# Patient Record
Sex: Female | Born: 1985 | Race: Black or African American | Hispanic: No | Smoking: Never smoker
Health system: Southern US, Community
[De-identification: ages and names within clinical notes are randomized; demographics above are authoritative.]

## PROBLEM LIST (undated history)

## (undated) DIAGNOSIS — I10 Essential (primary) hypertension: Secondary | ICD-10-CM

## (undated) HISTORY — PX: NO PAST SURGERIES: SHX2092

---

## 2014-03-03 LAB — PREGNANCY, URINE: Preg Test, Ur: POSITIVE

## 2014-03-17 ENCOUNTER — Other Ambulatory Visit (HOSPITAL_COMMUNITY)
Admission: RE | Admit: 2014-03-17 | Discharge: 2014-03-17 | Disposition: A | Discharge status: Home / Self Care | Payer: Medicaid Other | Source: Ambulatory Visit | Attending: Family Medicine | Admitting: Family Medicine

## 2014-03-17 ENCOUNTER — Ambulatory Visit (INDEPENDENT_AMBULATORY_CARE_PROVIDER_SITE_OTHER): Payer: Medicaid Other | Admitting: Family Medicine

## 2014-03-17 ENCOUNTER — Encounter: Payer: Self-pay | Admitting: Family Medicine

## 2014-03-17 VITALS — BP 127/85 | Temp 97.4°F | Ht 63.0 in | Wt 183.5 lb

## 2014-03-17 DIAGNOSIS — O0930 Supervision of pregnancy with insufficient antenatal care, unspecified trimester: Secondary | ICD-10-CM

## 2014-03-17 DIAGNOSIS — O0933 Supervision of pregnancy with insufficient antenatal care, third trimester: Secondary | ICD-10-CM

## 2014-03-17 DIAGNOSIS — Z01419 Encounter for gynecological examination (general) (routine) without abnormal findings: Secondary | ICD-10-CM | POA: Insufficient documentation

## 2014-03-17 DIAGNOSIS — Z113 Encounter for screening for infections with a predominantly sexual mode of transmission: Secondary | ICD-10-CM | POA: Insufficient documentation

## 2014-03-17 DIAGNOSIS — Z23 Encounter for immunization: Secondary | ICD-10-CM

## 2014-03-17 LAB — POCT URINALYSIS DIP (DEVICE)
Bilirubin Urine: NEGATIVE
Glucose, UA: NEGATIVE mg/dL
HGB URINE DIPSTICK: NEGATIVE
KETONES UR: NEGATIVE mg/dL
Leukocytes, UA: NEGATIVE
Nitrite: NEGATIVE
PH: 6 (ref 5.0–8.0)
PROTEIN: 30 mg/dL — AB
UROBILINOGEN UA: 0.2 mg/dL (ref 0.0–1.0)

## 2014-03-17 MED ORDER — PRENATAL 27-0.8 MG PO TABS: 1.0000 | ORAL_TABLET | Freq: Every day | ORAL | Status: DC

## 2014-03-17 MED ORDER — TETANUS-DIPHTH-ACELL PERTUSSIS 5-2.5-18.5 LF-MCG/0.5 IM SUSP: 0.5000 mL | Freq: Once | INTRAMUSCULAR | Status: DC

## 2014-03-17 NOTE — Progress Notes (Signed)
Subjective:    Stephanie Evans is being seen today for her first obstetrical visit.  This is not a planned pregnancy. She is at Unknown gestation. Her obstetrical history is significant for short interval pregnancy. No other risk factors. Relationship with FOB: significant other, living together. Patient does intend to breast feed. Pregnancy history fully reviewed.  Pt was [facility] once at [facility] and had an US at ~21wks. Per patient that would put her at 5831 but does not know exactly. Furthermore, pt does not remember her LMP as she was irregular while breastfeeding. Pt reports prior delivery was term at ~6.5lbs without any complications. Pt was not induced. Pt denies hx   Menstrual History: OB History   Grav Para Term Preterm Abortions TAB SAB Ect Mult Living   2 1 1  0 0 0 0 0 0 1      No LMP recorded. Patient is pregnant.    The following portions of the patient's history were reviewed and updated as appropriate: allergies, current medications, past family history, past medical history, past social history, past surgical history and problem list.  Review of Systems Pertinent items are noted in HPI.    Objective:    BP 127/85  Temp(Src) 97.4 F (36.3 C)  Ht 5\' 3"  (1.6 m)  Wt 83.235 kg (183 lb 8 oz)  BMI 32.51 kg/m2  General Appearance:    Alert, cooperative, no distress, appears stated age  Head:    Normocephalic, without obvious abnormality, atraumatic        Nose:   Nares normal, septum midline, mucosa normal, no drainage    or sinus tenderness  Throat:   Lips, mucosa, and tongue normal; teeth and gums normal  Neck:   Supple, symmetrical, trachea midline, no adenopathy;    thyroid:    Back:     Symmetric, no curvature, ROM normal, no CVA tenderness  Lungs:     Clear to auscultation bilaterally, respirations unlabored  Chest Wall:    No tenderness or deformity   Heart:    Regular rate and rhythm, S1 and S2 normal, no murmur, rub   or gallop     Abdomen:     Soft,  non-tender, bowel sounds active all four quadrants,    no masses, no organomegaly  Genitalia:    Normal female without lesion, discharge or tenderness     Extremities:   Extremities normal, atraumatic, no cyanosis or edema  Pulses:   2+ and symmetric all extremities  Skin:   Skin color, texture, turgor normal, no rashes or lesions  Lymph nodes:   Cervical, supraclavicular, and axillary nodes normal  Neurologic:   CN Grossly intact, normal strength, sensation and reflexes    throughout      Assessment:   Stephanie Evans is a 28 y.o. G2P1001 at Unknown gestational age but ~31wk here for New OB visit.   Discussed with Patient: - All new OB labs ordered. (cbc, abo/RH,  GC/C, RPR, Rubella, HBsAg, HIV, Urine Cx) -Plans to breast feed.  All questions answered. -Continue prenatal vitamins. - too late to care for genetic screening -Reviewed fetal kick counts (Pt to perform daily at a time when the baby is active, lie laterally with both hands on belly in quiet room and count all movements (hiccups, shoulder rolls, obvious kicks, etc); pt is to report to clinic or MAU for less than 10 movements felt in a one hour time period-pt told as soon as she counts 10 movements the count is complete.)  -  Routine precautions discussed (depression, infection s/s).   Patient provided with all pertinent phone numbers for emergencies. - RTC for any VB, regular, painful cramps/ctxs occurring at a rate of >2/10 min, fever (100.5 or higher), n/v/d, any pain that is unresolving or worsening, LOF, decreased fetal movement, CP, SOB, edema - f/u 2wks in [facility]  Problems: Patient Active Problem List   Diagnosis Date Noted  . Insufficient prenatal care in third trimester 03/17/2014    To Do: 1. Prenatal labs, order [place], Tdap, Glucola, Pap smear, gc/c, no GBS at this visit after [place]  [x ] Vaccines: Flu:  Tdap: today [x ] BCM: mirena  Edu: [x ] PTL precautions; [ ]  BF class; [ ]  childbirth class; [ ]   BF counseling;

## 2014-03-17 NOTE — Patient Instructions (Signed)
Third Trimester of Pregnancy  The third trimester is from week 29 through week 42, months 7 through 9. The third trimester is a time when the fetus is growing rapidly. At the end of the ninth month, the fetus is about 20 inches in length and weighs 6 10 pounds.   BODY CHANGES  Your body goes through many changes during pregnancy. The changes vary from woman to woman.    Your weight will continue to increase. You can expect to gain 25 35 pounds (11 16 kg) by the end of the pregnancy.   You may begin to get stretch marks on your hips, abdomen, and breasts.   You may urinate more often because the fetus is moving lower into your pelvis and pressing on your bladder.   You may develop or continue to have heartburn as a result of your pregnancy.   You may develop constipation because certain hormones are causing the muscles that push waste through your intestines to slow down.   You may develop hemorrhoids or swollen, bulging veins (varicose veins).   You may have pelvic pain because of the weight gain and pregnancy hormones relaxing your joints between the bones in your pelvis. Back aches may result from over exertion of the muscles supporting your posture.   Your breasts will continue to grow and be tender. A yellow discharge may leak from your breasts called colostrum.   Your belly button may stick out.   You may feel short of breath because of your expanding uterus.   You may notice the fetus "dropping," or moving lower in your abdomen.   You may have a bloody mucus discharge. This usually occurs a few days to a week before labor begins.   Your cervix becomes thin and soft (effaced) near your due date.  WHAT TO EXPECT AT YOUR PRENATAL EXAMS   You will have prenatal exams every 2 weeks until week 36. Then, you will have weekly prenatal exams. During a routine prenatal visit:   You will be weighed to make sure you and the fetus are growing normally.   Your blood pressure is taken.   Your abdomen will be  measured to track your baby's growth.   The fetal heartbeat will be listened to.   Any test results from the previous visit will be discussed.   You may have a cervical check near your due date to see if you have effaced.  At around 36 weeks, your caregiver will check your cervix. At the same time, your caregiver will also perform a test on the secretions of the vaginal tissue. This test is to determine if a type of bacteria, Group B streptococcus, is present. Your caregiver will explain this further.  Your caregiver may ask you:   What your birth plan is.   How you are feeling.   If you are feeling the baby move.   If you have had any abnormal symptoms, such as leaking fluid, bleeding, severe headaches, or abdominal cramping.   If you have any questions.  Other tests or screenings that may be performed during your third trimester include:   Blood tests that check for low iron levels (anemia).   Fetal testing to check the health, activity level, and growth of the fetus. Testing is done if you have certain medical conditions or if there are problems during the pregnancy.  FALSE LABOR  You may feel small, irregular contractions that eventually go away. These are called Braxton Hicks contractions, or   false labor. Contractions may last for hours, days, or even weeks before true labor sets in. If contractions come at regular intervals, intensify, or become painful, it is best to be seen by your caregiver.   SIGNS OF LABOR    Menstrual-like cramps.   Contractions that are 5 minutes apart or less.   Contractions that start on the top of the uterus and spread down to the lower abdomen and back.   A sense of increased pelvic pressure or back pain.   A watery or bloody mucus discharge that comes from the vagina.  If you have any of these signs before the 37th week of pregnancy, call your caregiver right away. You need to go to the hospital to get checked immediately.  HOME CARE INSTRUCTIONS    Avoid all  smoking, herbs, alcohol, and unprescribed drugs. These chemicals affect the formation and growth of the baby.   Follow your caregiver's instructions regarding medicine use. There are medicines that are either safe or unsafe to take during pregnancy.   Exercise only as directed by your caregiver. Experiencing uterine cramps is a good sign to stop exercising.   Continue to eat regular, healthy meals.   Wear a good support bra for breast tenderness.   Do not use hot tubs, steam rooms, or saunas.   Wear your seat belt at all times when driving.   Avoid raw meat, uncooked cheese, cat litter boxes, and soil used by cats. These carry germs that can cause birth defects in the baby.   Take your prenatal vitamins.   Try taking a stool softener (if your caregiver approves) if you develop constipation. Eat more high-fiber foods, such as fresh vegetables or fruit and whole grains. Drink plenty of fluids to keep your urine clear or pale yellow.   Take warm sitz baths to soothe any pain or discomfort caused by hemorrhoids. Use hemorrhoid cream if your caregiver approves.   If you develop varicose veins, wear support hose. Elevate your feet for 15 minutes, 3 4 times a day. Limit salt in your diet.   Avoid heavy lifting, wear low heal shoes, and practice good posture.   Rest a lot with your legs elevated if you have leg cramps or low back pain.   Visit your dentist if you have not gone during your pregnancy. Use a soft toothbrush to brush your teeth and be gentle when you floss.   A sexual relationship may be continued unless your caregiver directs you otherwise.   Do not travel far distances unless it is absolutely necessary and only with the approval of your caregiver.   Take prenatal classes to understand, practice, and ask questions about the labor and delivery.   Make a trial run to the hospital.   Pack your hospital bag.   Prepare the baby's nursery.   Continue to go to all your prenatal visits as directed  by your caregiver.  SEEK MEDICAL CARE IF:   You are unsure if you are in labor or if your water has broken.   You have dizziness.   You have mild pelvic cramps, pelvic pressure, or nagging pain in your abdominal area.   You have persistent nausea, vomiting, or diarrhea.   You have a bad smelling vaginal discharge.   You have pain with urination.  SEEK IMMEDIATE MEDICAL CARE IF:    You have a fever.   You are leaking fluid from your vagina.   You have spotting or bleeding from your vagina.     You have severe abdominal cramping or pain.   You have rapid weight loss or gain.   You have shortness of breath with chest pain.   You notice sudden or extreme swelling of your face, hands, ankles, feet, or legs.   You have not felt your baby move in over an hour.   You have severe headaches that do not go away with medicine.   You have vision changes.  Document Released: 11/29/2001 Document Revised: 08/07/2013 Document Reviewed: 02/05/2013  Exit[employer] Patient Information 2014 [employer].

## 2014-03-17 NOTE — Progress Notes (Signed)
U/S scheduled 03/25/14 at 830 am. ROI signed for [facility] records.

## 2014-03-17 NOTE — Progress Notes (Signed)
Nutrition note: 1st visit consult Pt has gained 18.5# @ 31w (due 05/16/14 per pt), which is wnl so far. Pt reports eating 2 meals & 1-2 snacks/d. Pt is taking a PNV. Pt reports no N/V but has some heartburn. NKFA. Pt received verbal & written education on general nutrition during pregnancy. Discussed tips to decrease heartburn. Discussed wt gain goals of 15-25# or 0.6#/wk. Pt agrees to continue taking a PNV. Pt does not have WIC but [place]. F/u as needed Blondell RevealLaura Autumn Pruitt, MS, RD, LDN, [facility]

## 2014-03-17 NOTE — Progress Notes (Signed)
P= 83 Pt. Reports that she found out she was pregnant at 5511w5d; reports she never had a period because she had been breast feeding her daughter who is now 18months. [facility]-- will request ultrasound records. Will need to schedule growth and anatomy ultrasound today.  No PNC prior to today. Will do all OB labs as well as 1hr gtt as pt. Reports being 31w today. Tdap today.

## 2014-03-18 LAB — PRESCRIPTION MONITORING PROFILE (19 PANEL)
Amphetamine/Meth: NEGATIVE ng/mL
BENZODIAZEPINE SCREEN, URINE: NEGATIVE ng/mL
Barbiturate Screen, Urine: NEGATIVE ng/mL
Buprenorphine, Urine: NEGATIVE ng/mL
CARISOPRODOL, URINE: NEGATIVE ng/mL
Cannabinoid Scrn, Ur: NEGATIVE ng/mL
Cocaine Metabolites: NEGATIVE ng/mL
Creatinine, Urine: 190.06 mg/dL (ref >20.0)
ECSTASY: NEGATIVE ng/mL
Fentanyl, Ur: NEGATIVE ng/mL
MEPERIDINE UR: NEGATIVE ng/mL
METHADONE SCREEN, URINE: NEGATIVE ng/mL
Methaqualone: NEGATIVE ng/mL
Nitrites, Initial: NEGATIVE ug/mL
Opiate Screen, Urine: NEGATIVE ng/mL
Oxycodone Screen, Ur: NEGATIVE ng/mL
PH URINE, INITIAL: 6.2 pH (ref 4.5–8.9)
Phencyclidine, Ur: NEGATIVE ng/mL
Propoxyphene: NEGATIVE ng/mL
TAPENTADOLUR: NEGATIVE ng/mL
TRAMADOL UR: NEGATIVE ng/mL
Zolpidem, Urine: NEGATIVE ng/mL

## 2014-03-18 LAB — HEMOGLOBINOPATHY EVALUATION
HEMOGLOBIN OTHER: 0 %
HGB A2 QUANT: 2.5 % (ref 2.2–3.2)
HGB A: 97.5 % (ref 96.8–97.8)
Hgb F Quant: 0 % (ref 0.0–2.0)
Hgb S Quant: 0 %

## 2014-03-18 LAB — OBSTETRIC PANEL
ANTIBODY SCREEN: NEGATIVE
BASOS ABS: 0 10*3/uL (ref 0.0–0.1)
Basophils Relative: 0 % (ref 0–1)
EOS ABS: 0.2 10*3/uL (ref 0.0–0.7)
EOS PCT: 2 % (ref 0–5)
HEMATOCRIT: 33 % — AB (ref 36.0–46.0)
Hemoglobin: 11.1 g/dL — ABNORMAL LOW (ref 12.0–15.0)
Hepatitis B Surface Ag: NEGATIVE
Lymphocytes Relative: 19 % (ref 12–46)
Lymphs Abs: 1.6 10*3/uL (ref 0.7–4.0)
MCH: 27.5 pg (ref 26.0–34.0)
MCHC: 33.6 g/dL (ref 30.0–36.0)
MCV: 81.7 fL (ref 78.0–100.0)
Monocytes Absolute: 0.5 10*3/uL (ref 0.1–1.0)
Monocytes Relative: 6 % (ref 3–12)
Neutro Abs: 6.3 10*3/uL (ref 1.7–7.7)
Neutrophils Relative %: 73 % (ref 43–77)
PLATELETS: 201 10*3/uL (ref 150–400)
RBC: 4.04 MIL/uL (ref 3.87–5.11)
RDW: 13.4 % (ref 11.5–15.5)
RUBELLA: 1.76 {index} — AB (ref <0.90)
Rh Type: POSITIVE
WBC: 8.6 10*3/uL (ref 4.0–10.5)

## 2014-03-18 LAB — HIV ANTIBODY (ROUTINE TESTING W REFLEX): HIV: NONREACTIVE

## 2014-03-18 LAB — WET PREP, GENITAL
TRICH WET PREP: NONE SEEN
WBC, Wet Prep HPF POC: NONE SEEN
Yeast Wet Prep HPF POC: NONE SEEN

## 2014-03-18 LAB — GLUCOSE TOLERANCE, 1 HOUR (50G) W/O FASTING: GLUCOSE 1 HOUR GTT: 144 mg/dL — AB (ref 70–140)

## 2014-03-19 ENCOUNTER — Encounter: Payer: Self-pay | Admitting: *Deleted

## 2014-03-20 LAB — CULTURE, OB URINE

## 2014-03-24 ENCOUNTER — Telehealth: Payer: Self-pay | Admitting: General Practice

## 2014-03-24 ENCOUNTER — Encounter: Payer: Self-pay | Admitting: Family Medicine

## 2014-03-24 ENCOUNTER — Telehealth: Payer: Self-pay | Admitting: *Deleted

## 2014-03-24 DIAGNOSIS — B9689 Other specified bacterial agents as the cause of diseases classified elsewhere: Secondary | ICD-10-CM

## 2014-03-24 DIAGNOSIS — O0930 Supervision of pregnancy with insufficient antenatal care, unspecified trimester: Secondary | ICD-10-CM

## 2014-03-24 DIAGNOSIS — N760 Acute vaginitis: Principal | ICD-10-CM

## 2014-03-24 MED ORDER — PRENATAL PLUS 27-1 MG PO TABS: 1.0000 | ORAL_TABLET | Freq: Every day | ORAL | Status: DC

## 2014-03-24 MED ORDER — METRONIDAZOLE 500 MG PO TABS: 500.0000 mg | ORAL_TABLET | Freq: Two times a day (BID) | ORAL | Status: DC

## 2014-03-24 NOTE — Telephone Encounter (Signed)
Contacted patient about Glucola results and need for 3 hour glucola test.  Pt agreed to Thursday 03/27/2014 @ 0800.  Pt instructions given for test. Pt verbalizes understanding.

## 2014-03-24 NOTE — Telephone Encounter (Signed)
Message copied by Dorothyann PengHAIZLIP, Marlis Oldaker E on Mon Mar 24, 2014  4:39 PM ------      Message from: Jolyn LentDOM, MICHAEL R      Created: Mon Mar 24, 2014  4:12 PM       Please coordinate 3hr glucola       ------

## 2014-03-24 NOTE — Telephone Encounter (Signed)
Per chart review, patient has few clue cells. Called patient and she stated that she has been having a discharge lately, informed patient of mild BV infection and that we would send the medication to her pharmacy. Patient verbalized understanding stated she has had this before and also requests PNV because her insurance won't cover the other ones prescribed. Told patient I would get both of these sent to her pharmacy. Patient verbalized understanding and had no further questions

## 2014-03-25 ENCOUNTER — Ambulatory Visit (HOSPITAL_COMMUNITY)
Admission: RE | Admit: 2014-03-25 | Discharge: 2014-03-25 | Disposition: A | Discharge status: Home / Self Care | Payer: Medicaid Other | Source: Ambulatory Visit | Attending: Family Medicine | Admitting: Family Medicine

## 2014-03-25 ENCOUNTER — Encounter: Payer: Self-pay | Admitting: Family Medicine

## 2014-03-25 DIAGNOSIS — O0930 Supervision of pregnancy with insufficient antenatal care, unspecified trimester: Secondary | ICD-10-CM | POA: Insufficient documentation

## 2014-03-25 DIAGNOSIS — O0933 Supervision of pregnancy with insufficient antenatal care, third trimester: Secondary | ICD-10-CM

## 2014-03-25 DIAGNOSIS — Z3689 Encounter for other specified antenatal screening: Secondary | ICD-10-CM | POA: Insufficient documentation

## 2014-03-25 DIAGNOSIS — Z23 Encounter for immunization: Secondary | ICD-10-CM

## 2014-03-27 ENCOUNTER — Other Ambulatory Visit: Payer: Medicaid Other

## 2014-03-31 ENCOUNTER — Other Ambulatory Visit: Payer: Medicaid Other

## 2014-03-31 DIAGNOSIS — O99814 Abnormal glucose complicating childbirth: Secondary | ICD-10-CM

## 2014-04-01 LAB — GLUCOSE TOLERANCE, 3 HOURS
GLUCOSE 3 HOUR GTT: 121 mg/dL (ref 70–144)
GLUCOSE, 1 HOUR-GESTATIONAL: 168 mg/dL (ref 70–189)
GLUCOSE, 2 HOUR-GESTATIONAL: 160 mg/dL (ref 70–164)
GLUCOSE, FASTING-GESTATIONAL: 72 mg/dL (ref 70–104)

## 2014-04-02 ENCOUNTER — Ambulatory Visit (INDEPENDENT_AMBULATORY_CARE_PROVIDER_SITE_OTHER): Payer: Medicaid Other | Admitting: Family Medicine

## 2014-04-02 ENCOUNTER — Encounter: Payer: Self-pay | Admitting: Family Medicine

## 2014-04-02 VITALS — BP 118/73 | Wt 184.8 lb

## 2014-04-02 DIAGNOSIS — O0930 Supervision of pregnancy with insufficient antenatal care, unspecified trimester: Secondary | ICD-10-CM

## 2014-04-02 DIAGNOSIS — O0933 Supervision of pregnancy with insufficient antenatal care, third trimester: Secondary | ICD-10-CM

## 2014-04-02 LAB — POCT URINALYSIS DIP (DEVICE)
Bilirubin Urine: NEGATIVE
GLUCOSE, UA: NEGATIVE mg/dL
HGB URINE DIPSTICK: NEGATIVE
Ketones, ur: NEGATIVE mg/dL
Leukocytes, UA: NEGATIVE
Nitrite: NEGATIVE
PROTEIN: NEGATIVE mg/dL
Specific Gravity, Urine: 1.025 (ref 1.005–1.030)
UROBILINOGEN UA: 0.2 mg/dL (ref 0.0–1.0)
pH: 7 (ref 5.0–8.0)

## 2014-04-02 NOTE — Progress Notes (Signed)
Pulse: 75

## 2014-04-02 NOTE — Patient Instructions (Signed)
Third Trimester of Pregnancy  The third trimester is from week 29 through week 42, months 7 through 9. The third trimester is a time when the fetus is growing rapidly. At the end of the ninth month, the fetus is about 20 inches in length and weighs 6 10 pounds.   BODY CHANGES  Your body goes through many changes during pregnancy. The changes vary from woman to woman.    Your weight will continue to increase. You can expect to gain 25 35 pounds (11 16 kg) by the end of the pregnancy.   You may begin to get stretch marks on your hips, abdomen, and breasts.   You may urinate more often because the fetus is moving lower into your pelvis and pressing on your bladder.   You may develop or continue to have heartburn as a result of your pregnancy.   You may develop constipation because certain hormones are causing the muscles that push waste through your intestines to slow down.   You may develop hemorrhoids or swollen, bulging veins (varicose veins).   You may have pelvic pain because of the weight gain and pregnancy hormones relaxing your joints between the bones in your pelvis. Back aches may result from over exertion of the muscles supporting your posture.   Your breasts will continue to grow and be tender. A yellow discharge may leak from your breasts called colostrum.   Your belly button may stick out.   You may feel short of breath because of your expanding uterus.   You may notice the fetus "dropping," or moving lower in your abdomen.   You may have a bloody mucus discharge. This usually occurs a few days to a week before labor begins.   Your cervix becomes thin and soft (effaced) near your due date.  WHAT TO EXPECT AT YOUR PRENATAL EXAMS   You will have prenatal exams every 2 weeks until week 36. Then, you will have weekly prenatal exams. During a routine prenatal visit:   You will be weighed to make sure you and the fetus are growing normally.   Your blood pressure is taken.   Your abdomen will be  measured to track your baby's growth.   The fetal heartbeat will be listened to.   Any test results from the previous visit will be discussed.   You may have a cervical check near your due date to see if you have effaced.  At around 36 weeks, your caregiver will check your cervix. At the same time, your caregiver will also perform a test on the secretions of the vaginal tissue. This test is to determine if a type of bacteria, Group B streptococcus, is present. Your caregiver will explain this further.  Your caregiver may ask you:   What your birth plan is.   How you are feeling.   If you are feeling the baby move.   If you have had any abnormal symptoms, such as leaking fluid, bleeding, severe headaches, or abdominal cramping.   If you have any questions.  Other tests or screenings that may be performed during your third trimester include:   Blood tests that check for low iron levels (anemia).   Fetal testing to check the health, activity level, and growth of the fetus. Testing is done if you have certain medical conditions or if there are problems during the pregnancy.  FALSE LABOR  You may feel small, irregular contractions that eventually go away. These are called Braxton Hicks contractions, or   false labor. Contractions may last for hours, days, or even weeks before true labor sets in. If contractions come at regular intervals, intensify, or become painful, it is best to be seen by your caregiver.   SIGNS OF LABOR    Menstrual-like cramps.   Contractions that are 5 minutes apart or less.   Contractions that start on the top of the uterus and spread down to the lower abdomen and back.   A sense of increased pelvic pressure or back pain.   A watery or bloody mucus discharge that comes from the vagina.  If you have any of these signs before the 37th week of pregnancy, call your caregiver right away. You need to go to the hospital to get checked immediately.  HOME CARE INSTRUCTIONS    Avoid all  smoking, herbs, alcohol, and unprescribed drugs. These chemicals affect the formation and growth of the baby.   Follow your caregiver's instructions regarding medicine use. There are medicines that are either safe or unsafe to take during pregnancy.   Exercise only as directed by your caregiver. Experiencing uterine cramps is a good sign to stop exercising.   Continue to eat regular, healthy meals.   Wear a good support bra for breast tenderness.   Do not use hot tubs, steam rooms, or saunas.   Wear your seat belt at all times when driving.   Avoid raw meat, uncooked cheese, cat litter boxes, and soil used by cats. These carry germs that can cause birth defects in the baby.   Take your prenatal vitamins.   Try taking a stool softener (if your caregiver approves) if you develop constipation. Eat more high-fiber foods, such as fresh vegetables or fruit and whole grains. Drink plenty of fluids to keep your urine clear or pale yellow.   Take warm sitz baths to soothe any pain or discomfort caused by hemorrhoids. Use hemorrhoid cream if your caregiver approves.   If you develop varicose veins, wear support hose. Elevate your feet for 15 minutes, 3 4 times a day. Limit salt in your diet.   Avoid heavy lifting, wear low heal shoes, and practice good posture.   Rest a lot with your legs elevated if you have leg cramps or low back pain.   Visit your dentist if you have not gone during your pregnancy. Use a soft toothbrush to brush your teeth and be gentle when you floss.   A sexual relationship may be continued unless your caregiver directs you otherwise.   Do not travel far distances unless it is absolutely necessary and only with the approval of your caregiver.   Take prenatal classes to understand, practice, and ask questions about the labor and delivery.   Make a trial run to the hospital.   Pack your hospital bag.   Prepare the baby's nursery.   Continue to go to all your prenatal visits as directed  by your caregiver.  SEEK MEDICAL CARE IF:   You are unsure if you are in labor or if your water has broken.   You have dizziness.   You have mild pelvic cramps, pelvic pressure, or nagging pain in your abdominal area.   You have persistent nausea, vomiting, or diarrhea.   You have a bad smelling vaginal discharge.   You have pain with urination.  SEEK IMMEDIATE MEDICAL CARE IF:    You have a fever.   You are leaking fluid from your vagina.   You have spotting or bleeding from your vagina.     You have severe abdominal cramping or pain.   You have rapid weight loss or gain.   You have shortness of breath with chest pain.   You notice sudden or extreme swelling of your face, hands, ankles, feet, or legs.   You have not felt your baby move in over an hour.   You have severe headaches that do not go away with medicine.   You have vision changes.  Document Released: 11/29/2001 Document Revised: 08/07/2013 Document Reviewed: 02/05/2013  Exit[employer] Patient Information 2014 [employer].

## 2014-04-02 NOTE — Progress Notes (Signed)
+  FM, no lof, no vb, no ctx No complaints Reviewed imaging - requires repeat in 4 weeks  Reeves DamDarnesa Francee GentileShubert is a 28 y.o. G2P1001 at 4634w5d by L=32 here for ROB visit.  Discussed with Patient:  -Plans to breast feed.  All questions answered. -Continue prenatal vitamins. -Reviewed fetal kick counts Pt to perform daily at a time when the baby is active, lie laterally with both hands on belly in quiet room and count all movements (hiccups, shoulder rolls, obvious kicks, etc); pt is to report to clinic L&D for less than 10 movements felt in a one hour time period-pt told as soon as she counts 10 movements the count is complete.  - Routine precautions discussed (depression, infection s/s).   Patient provided with all pertinent phone numbers for emergencies. - RTC for any VB, regular, painful cramps/ctxs occurring at a rate of >2/10 min, fever (100.5 or higher), n/v/d, any pain that is unresolving or worsening, LOF, decreased fetal movement, CP, SOB, edema - RTC in 2 weeks for next appt. - GBS test next visit  Problems: Patient Active Problem List   Diagnosis Date Noted  . Insufficient prenatal care in third trimester 03/17/2014    To Do: 1.  [x ] Vaccines: Flu:dec  Tdap: recd [ x] BCM: mirena [ x] Readiness: baby has a place to sleep, car seat, other baby necessities.  Edu: [x ] PTL precautions; [ ]  BF class; [ ]  childbirth class; [ ]   BF counseling;

## 2014-04-02 NOTE — Progress Notes (Signed)
US for growth scheduled on 5/5 @ 0930

## 2014-04-16 ENCOUNTER — Encounter: Payer: Medicaid Other | Admitting: Advanced Practice Midwife

## 2014-04-22 ENCOUNTER — Ambulatory Visit (HOSPITAL_COMMUNITY)
Admission: RE | Admit: 2014-04-22 | Discharge: 2014-04-22 | Disposition: A | Discharge status: Home / Self Care | Payer: Medicaid Other | Source: Ambulatory Visit | Attending: Family Medicine | Admitting: Family Medicine

## 2014-04-22 DIAGNOSIS — O0933 Supervision of pregnancy with insufficient antenatal care, third trimester: Secondary | ICD-10-CM

## 2014-04-22 DIAGNOSIS — O0930 Supervision of pregnancy with insufficient antenatal care, unspecified trimester: Secondary | ICD-10-CM | POA: Insufficient documentation

## 2014-04-22 DIAGNOSIS — Z3689 Encounter for other specified antenatal screening: Secondary | ICD-10-CM | POA: Insufficient documentation

## 2014-04-24 ENCOUNTER — Ambulatory Visit (INDEPENDENT_AMBULATORY_CARE_PROVIDER_SITE_OTHER): Payer: Medicaid Other | Admitting: Obstetrics & Gynecology

## 2014-04-24 VITALS — BP 117/79 | HR 87 | Temp 97.0°F | Wt 186.0 lb

## 2014-04-24 DIAGNOSIS — Z3480 Encounter for supervision of other normal pregnancy, unspecified trimester: Secondary | ICD-10-CM

## 2014-04-24 DIAGNOSIS — Z3490 Encounter for supervision of normal pregnancy, unspecified, unspecified trimester: Secondary | ICD-10-CM

## 2014-04-24 DIAGNOSIS — O0933 Supervision of pregnancy with insufficient antenatal care, third trimester: Secondary | ICD-10-CM

## 2014-04-24 DIAGNOSIS — O0930 Supervision of pregnancy with insufficient antenatal care, unspecified trimester: Secondary | ICD-10-CM

## 2014-04-24 LAB — POCT URINALYSIS DIP (DEVICE)
BILIRUBIN URINE: NEGATIVE
Glucose, UA: NEGATIVE mg/dL
Hgb urine dipstick: NEGATIVE
Ketones, ur: NEGATIVE mg/dL
LEUKOCYTES UA: NEGATIVE
NITRITE: NEGATIVE
PH: 6 (ref 5.0–8.0)
Protein, ur: 30 mg/dL — AB
Specific Gravity, Urine: >=1.03 (ref 1.005–1.030)
UROBILINOGEN UA: 0.2 mg/dL (ref 0.0–1.0)

## 2014-04-24 NOTE — Patient Instructions (Signed)

## 2014-04-24 NOTE — Progress Notes (Signed)
CT an GC and GBS today. US nl growth poor dating though

## 2014-04-25 LAB — GC/CHLAMYDIA PROBE AMP
CT Probe RNA: NEGATIVE
GC Probe RNA: NEGATIVE

## 2014-04-27 LAB — CULTURE, STREPTOCOCCUS GRP B W/SUSCEPT

## 2014-05-01 ENCOUNTER — Encounter: Payer: Self-pay | Admitting: Obstetrics & Gynecology

## 2014-05-01 ENCOUNTER — Encounter: Payer: Medicaid Other | Admitting: Obstetrics & Gynecology

## 2014-05-03 ENCOUNTER — Encounter: Payer: Self-pay | Admitting: *Deleted

## 2014-05-14 ENCOUNTER — Encounter: Payer: Medicaid Other | Admitting: Advanced Practice Midwife

## 2014-05-16 ENCOUNTER — Encounter: Payer: Self-pay | Admitting: General Practice

## 2014-05-16 ENCOUNTER — Encounter: Payer: Medicaid Other | Admitting: Family

## 2014-06-16 ENCOUNTER — Encounter: Payer: Self-pay | Admitting: Family Medicine

## 2014-06-16 ENCOUNTER — Ambulatory Visit (INDEPENDENT_AMBULATORY_CARE_PROVIDER_SITE_OTHER): Payer: Medicaid Other | Admitting: Family Medicine

## 2014-06-16 VITALS — BP 139/90 | HR 87 | Temp 97.5°F | Ht 63.0 in | Wt 172.2 lb

## 2014-06-16 DIAGNOSIS — Z01812 Encounter for preprocedural laboratory examination: Secondary | ICD-10-CM

## 2014-06-16 DIAGNOSIS — Z30430 Encounter for insertion of intrauterine contraceptive device: Secondary | ICD-10-CM

## 2014-06-16 LAB — POCT PREGNANCY, URINE: Preg Test, Ur: NEGATIVE

## 2014-06-16 MED ORDER — LEVONORGESTREL 20 MCG/24HR IU IUD
INTRAUTERINE_SYSTEM | Freq: Once | INTRAUTERINE | Status: AC
  Administered 2014-06-16: 1 via INTRAUTERINE

## 2014-06-16 NOTE — Patient Instructions (Signed)

## 2014-06-16 NOTE — Progress Notes (Signed)
Patient ID: Stephanie KaufmanDarnesa Lasker, female   DOB: 1986-11-14, 28 y.o.   MRN: [id number] Subjective:    Stephanie KaufmanDarnesa Cohen is a 28 y.o. 212P2002 African American female who presents for a postpartum visit. She is 6 weeks postpartum following a spontaneous vaginal delivery. I have fully reviewed the prenatal and intrapartum course. The delivery was at 39 gestational weeks. Outcome: spontaneous vaginal delivery. Anesthesia: none. Postpartum course has been uncomplicated. Baby's course has been uncomplicated. Baby is feeding by breast. Bleeding stopped and now returned. Bowel function is normal. Bladder function is normal. Patient is not sexually active. Contraception method is IUD. Postpartum depression screening: negative.  The following portions of the patient's history were reviewed and updated as appropriate: allergies, current medications, past medical history, past surgical history and problem list.  Review of Systems Pertinent items are noted in HPI.   Filed Vitals:   06/16/14 1435 06/16/14 1437  BP: 139/90 139/90  Pulse: 87   Temp: 97.5 F (36.4 C)   TempSrc: Oral   Height: 5\' 3"  (1.6 m)   Weight: 172 lb 3.2 oz (78.109 kg)     Objective:     General:  alert, cooperative and no distress   Breasts:  deferred, no complaints  Lungs: clear to auscultation bilaterally  Heart:  regular rate and rhythm  Abdomen: soft, nontender   Vulva: normal  Vagina: normal vagina  Cervix:  closed  Corpus: Well-involuted  Adnexa:  Non-palpable  Rectal Exam: no hemorrhoids        Assessment:   normal postpartum exam 6 wks s/p NSVD Depression screening Contraception counseling   Plan:   Contraception: IUD Follow up in: 4 weeks for string check or as needed.   MIRENA IUD INSERTION  The risks and benefits of the method and placement have been thouroughly reviewed with the patient and all questions were answered.  Specifically the patient is aware of failure rate of 12/998, expulsion of the IUD and of  possible perforation.  The patient is aware of irregular bleeding due to the method and understands the incidence of irregular bleeding diminishes with time.  Signed copy of informed consent in chart.   Time out was performed.  A Pederson speculum was placed in the vagina.  The cervix was visualized, prepped using Betadin, and grasped with a single tooth tenaculum. The uterus was found to be anteroflexed and it sounded to 9 cm.  Mirena IUD placed per manufacturer's recommendations.   The strings were trimmed to 3 cm.  The patient was given post procedure instructions, including signs and symptoms of infection and to check for the strings after each menses or each month, and refraining from intercourse or anything in the vagina for 3 days.  She was given a Mirena care card with date Mirena placed, and date Mirena to be removed.  She is scheduled for a return appointment after her first menses or 4 weeks.  Kalis Friese L 06/16/2014 3:27 PM

## 2014-07-11 ENCOUNTER — Encounter: Payer: Self-pay | Admitting: General Practice

## 2014-07-29 ENCOUNTER — Encounter: Payer: Self-pay | Admitting: *Deleted

## 2014-07-29 ENCOUNTER — Telehealth: Payer: Self-pay | Admitting: *Deleted

## 2014-07-29 NOTE — Progress Notes (Signed)
FMLA papers completed. 

## 2014-07-29 NOTE — Telephone Encounter (Signed)
Attempted to contact patient, no answer, mailbox full, unable to leave a message.  FMLA paperwork completed, [facility] [phone].  Paperwork placed in front office box.

## 2014-08-12 ENCOUNTER — Encounter: Payer: Self-pay | Admitting: General Practice

## 2014-08-28 ENCOUNTER — Encounter: Payer: Self-pay | Admitting: *Deleted

## 2014-08-28 ENCOUNTER — Ambulatory Visit: Payer: Medicaid Other | Admitting: Obstetrics and Gynecology

## 2014-08-28 ENCOUNTER — Telehealth: Payer: Self-pay | Admitting: *Deleted

## 2014-08-28 NOTE — Telephone Encounter (Signed)
Attempted to call patient with both numbers listed, numbers are invalid.  Will send letter.  Letter sent

## 2014-10-20 ENCOUNTER — Encounter: Payer: Self-pay | Admitting: Family Medicine

## 2015-01-07 IMAGING — US US OB COMP +14 WK
1 series · 12 of 28 positions shown · non-contrast
Comparison: none

[Series 1: us ob comp +14 wk · 12 of 68 slices shown]
[im 3/68]
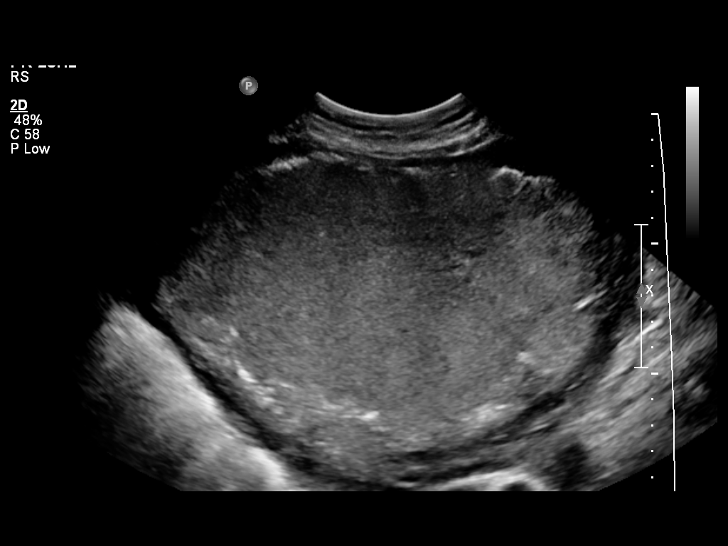
[im 8/68]
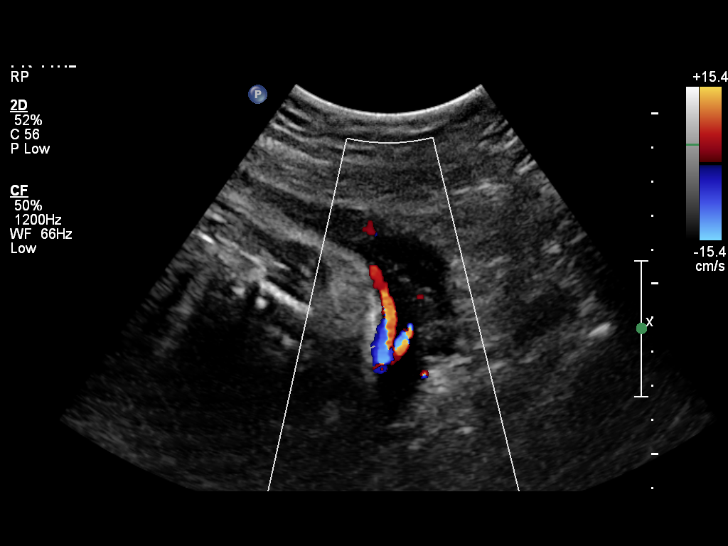
[im 13/68]
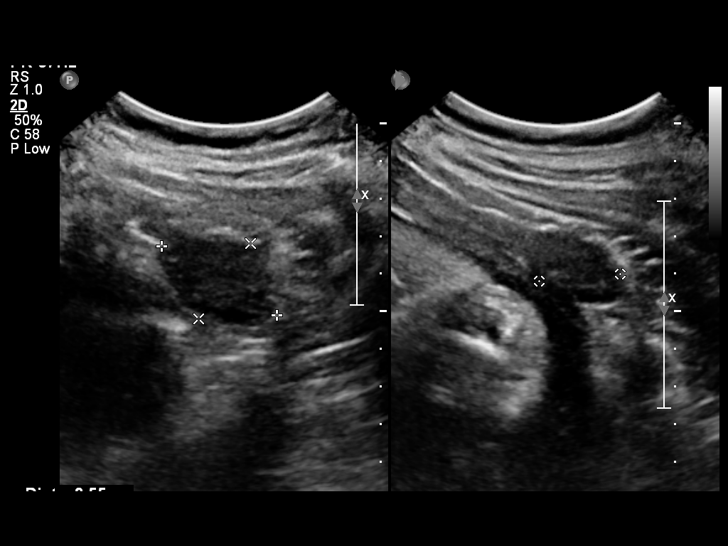
[im 20/68]
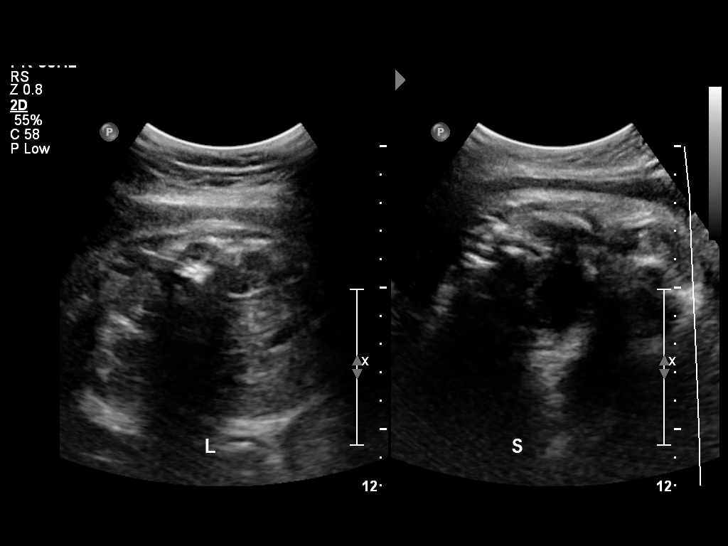
[im 25/68]
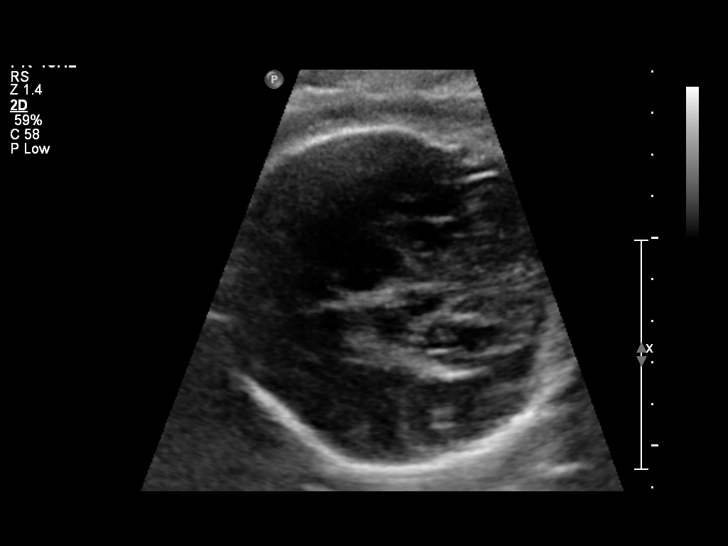
[im 30/68]
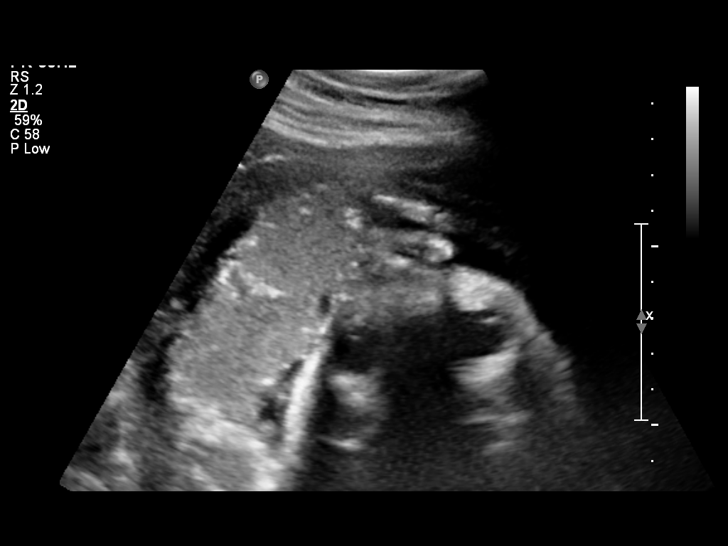
[im 38/68]
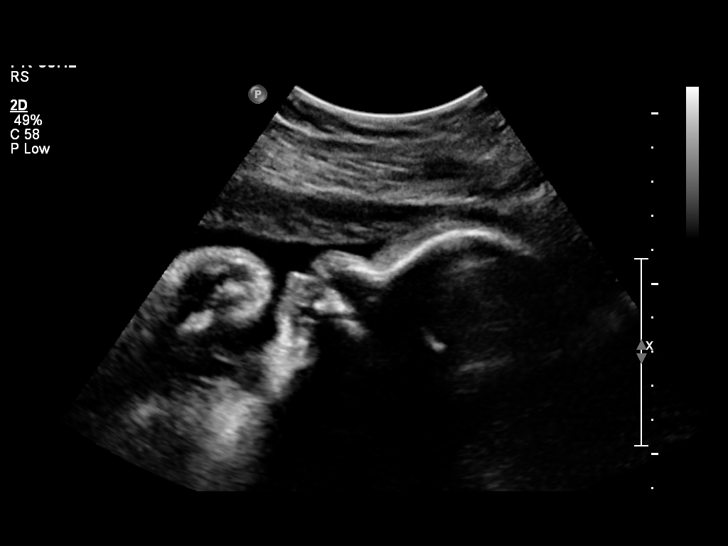
[im 43/68]
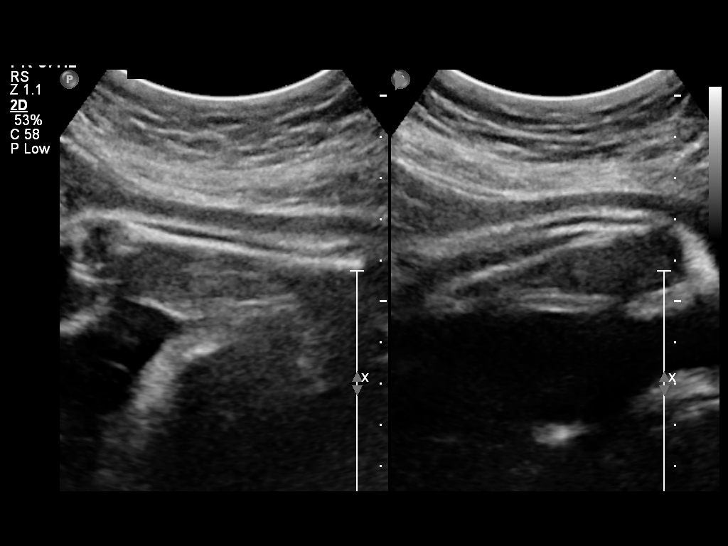
[im 48/68]
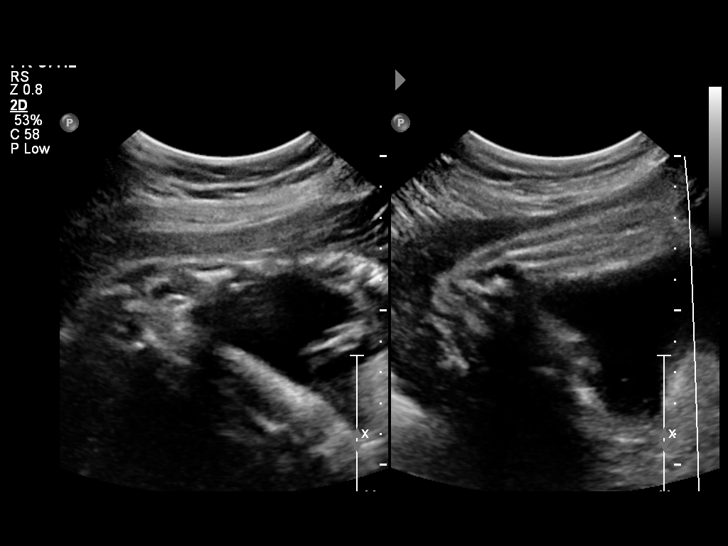
[im 55/68]
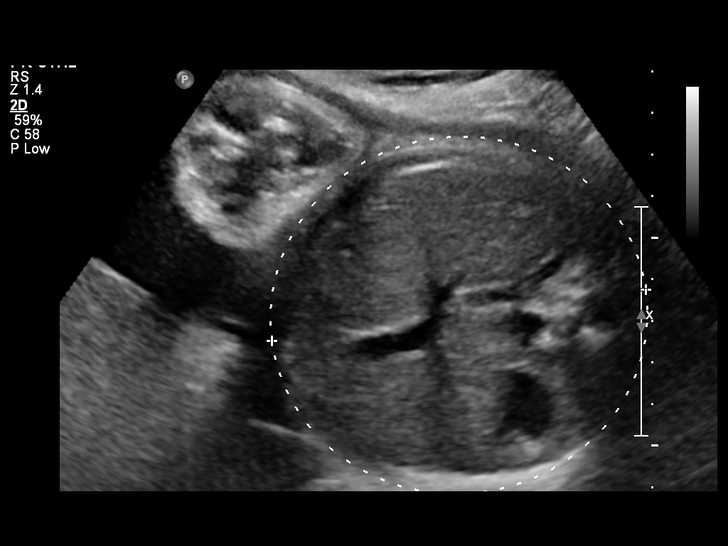
[im 60/68]
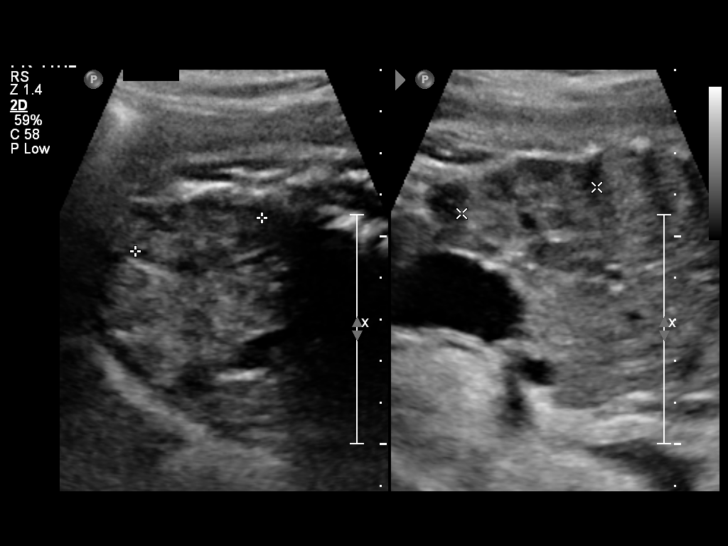
[im 65/68]
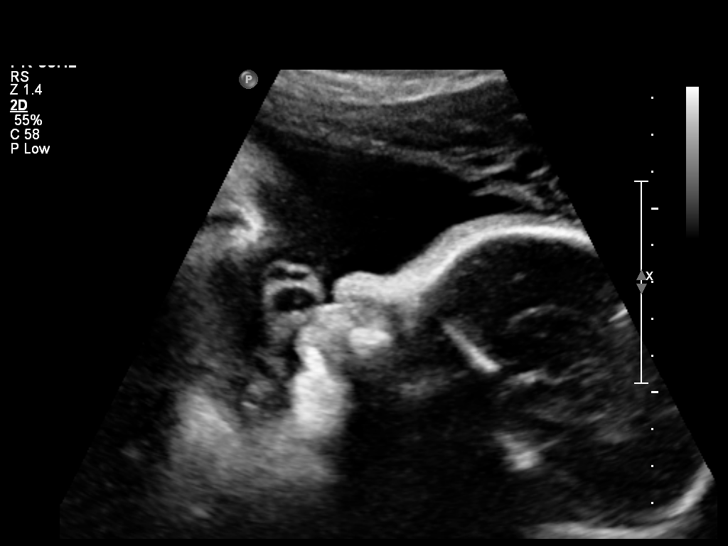

[12 of 28 positions shown; findings below may reference images not displayed]

OBSTETRICS REPORT
                      (Signed Final 03/25/2014 [DATE])

Service(s) Provided

 US OB COMP + 14 WK                                    76805.1
Indications

 Basic anatomic survey
 No or Little Prenatal Care
 Unsure of LMP;  Establish Gestational [AGE]
Fetal Evaluation

 Num Of Fetuses:    1
 Fetal Heart Rate:  139                          bpm
 Cardiac Activity:  Observed
 Presentation:      Cephalic
 Placenta:          Fundal, above cervical os
 P. Cord            Visualized, central
 Insertion:

 Amniotic Fluid
 AFI FV:      Subjectively within normal limits
 AFI Sum:     11.92   cm       31  %Tile     Larg Pckt:    5.53  cm
 RUQ:   4.31    cm   RLQ:    2.08   cm    LUQ:   5.53    cm
Biometry

 BPD:     79.4  mm     G. Age:  31w 6d                CI:        72.37   70 - 86
                                                      FL/HC:      21.6   19.1 -

 HC:     296.9  mm     G. Age:  32w 6d       24  %    HC/AC:      1.08   0.96 -

 AC:     274.4  mm     G. Age:  31w 4d       24  %    FL/BPD:     80.7   71 - 87
 FL:      64.1  mm     G. Age:  33w 1d       56  %    FL/AC:      23.4   20 - 24
 HUM:     57.4  mm     G. Age:  33w 2d       72  %

 Est. FW:    9791  gm      4 lb 4 oz     52  %
Gestational Age

 U/S Today:     32w 3d                                        EDD:   05/17/14
 Best:          32w 3d     Det. By:  U/S (03/25/14)           EDD:   05/17/14
Anatomy

 Cranium:          Appears normal         Aortic Arch:      Not well visualized
 Fetal Cavum:      Appears normal         Ductal Arch:      Not well visualized
 Ventricles:       Appears normal         Diaphragm:        Not well visualized
 Choroid Plexus:   Appears normal         Stomach:          Appears normal
 Cerebellum:       Appears normal         Abdomen:          Appears normal
 Posterior Fossa:  Appears normal         Abdominal Wall:   Not well visualized
 Nuchal Fold:      Not applicable (>20    Cord Vessels:     Appears normal (3
                   wks GA)                                  vessel cord)
 Face:             Appears normal         Kidneys:          Appear normal
                   (orbits and profile)
 Lips:             Appears normal         Bladder:          Appears normal
 Heart:            Not well visualized    Spine:            Appears normal
 RVOT:             Not well visualized    Lower             Appears normal
                                          Extremities:
 LVOT:             Not well visualized    Upper             Appears normal
                                          Extremities:

 Other:  Fetus appears to be a female. Heels and 5th digit visualized.
         Technically difficult due to advanced GA and fetal position.
Cervix Uterus Adnexa

 Cervix:       Not visualized (advanced GA >86wks)
 Uterus:       No abnormality visualized.
 Cul De Sac:   No free fluid seen.
 Left Ovary:    Within normal limits.
 Right Ovary:   Within normal limits.
 Adnexa:     No abnormality visualized.
Impression

 Single IUP at 32 [DATE] weeks
 Limited views of the fetal anatomy obtained due to advanced
 gestational age and fetal position
 No gross anomalies identified
 Fetal growth is appropriate (52nd %tile)
 Fundal placenta
 Normal amniotic fluid volume
Recommendations

 Recommend follow-up ultrasound examination in 4 weeks for
 interval growth due to poor dates / late onset of care (please
 schedule)

## 2015-01-23 ENCOUNTER — Encounter: Payer: Self-pay | Admitting: General Practice

## 2018-05-30 ENCOUNTER — Encounter (HOSPITAL_COMMUNITY): Payer: Self-pay | Admitting: Emergency Medicine

## 2018-05-30 ENCOUNTER — Emergency Department (HOSPITAL_COMMUNITY)
Admission: EM | Admit: 2018-05-30 | Discharge: 2018-05-30 | Disposition: A | Discharge status: Home / Self Care | Payer: BLUE CROSS/BLUE SHIELD | Attending: Emergency Medicine | Admitting: Emergency Medicine

## 2018-05-30 ENCOUNTER — Emergency Department (HOSPITAL_COMMUNITY): Payer: BLUE CROSS/BLUE SHIELD

## 2018-05-30 DIAGNOSIS — R079 Chest pain, unspecified: Secondary | ICD-10-CM | POA: Diagnosis present

## 2018-05-30 DIAGNOSIS — R0789 Other chest pain: Secondary | ICD-10-CM | POA: Diagnosis not present

## 2018-05-30 LAB — BASIC METABOLIC PANEL
Anion gap: 9 (ref 5–15)
BUN: 12 mg/dL (ref 6–20)
CO2: 26 mmol/L (ref 22–32)
CREATININE: 0.99 mg/dL (ref 0.44–1.00)
Calcium: 9.1 mg/dL (ref 8.9–10.3)
Chloride: 104 mmol/L (ref 101–111)
GFR calc Af Amer: >60 mL/min (ref >60)
GFR calc non Af Amer: >60 mL/min (ref >60)
GLUCOSE: 88 mg/dL (ref 65–99)
Potassium: 3.4 mmol/L — ABNORMAL LOW (ref 3.5–5.1)
SODIUM: 139 mmol/L (ref 135–145)

## 2018-05-30 LAB — CBC WITH DIFFERENTIAL/PLATELET
Abs Immature Granulocytes: 0 10*3/uL (ref 0.0–0.1)
Basophils Absolute: 0 10*3/uL (ref 0.0–0.1)
Basophils Relative: 0 %
EOS PCT: 2 %
Eosinophils Absolute: 0.1 10*3/uL (ref 0.0–0.7)
HEMATOCRIT: 44.1 % (ref 36.0–46.0)
Hemoglobin: 14.1 g/dL (ref 12.0–15.0)
Immature Granulocytes: 0 %
LYMPHS ABS: 2.9 10*3/uL (ref 0.7–4.0)
Lymphocytes Relative: 41 %
MCH: 27.8 pg (ref 26.0–34.0)
MCHC: 32 g/dL (ref 30.0–36.0)
MCV: 86.8 fL (ref 78.0–100.0)
MONO ABS: 0.6 10*3/uL (ref 0.1–1.0)
MONOS PCT: 8 %
Neutro Abs: 3.4 10*3/uL (ref 1.7–7.7)
Neutrophils Relative %: 49 %
Platelets: 302 10*3/uL (ref 150–400)
RBC: 5.08 MIL/uL (ref 3.87–5.11)
RDW: 12.8 % (ref 11.5–15.5)
WBC: 6.9 10*3/uL (ref 4.0–10.5)

## 2018-05-30 LAB — I-STAT TROPONIN, ED
Troponin i, poc: 0 ng/mL (ref 0.00–0.08)
Troponin i, poc: 0 ng/mL (ref 0.00–0.08)

## 2018-05-30 LAB — I-STAT BETA HCG BLOOD, ED (MC, WL, AP ONLY): I-stat hCG, quantitative: <5 m[IU]/mL (ref <5)

## 2018-05-30 MED ORDER — IBUPROFEN 800 MG PO TABS: 800.0000 mg | ORAL_TABLET | Freq: Three times a day (TID) | ORAL | 0 refills | Status: DC | PRN

## 2018-05-30 NOTE — ED Triage Notes (Signed)
Patient complains of left sided chest pressure that started yesterday. Pain worse with deep breathing. Patient alert and oriented and in no apparent distress at this time.

## 2018-05-30 NOTE — ED Provider Notes (Signed)
Patient placed in Quick Look pathway, seen and evaluated   Chief Complaint: Chest Pain   HPI:   32 y.o. female who presents for evaluation of left-sided chest pain that began yesterday.  She describes it is a constant pressure.  Is reports that it was slightly worse with deep inspiration but she did not get short of breath with the pain.  She reports it is also worse when she leans forward.  Patient states that she has not taken anything for the pain.  She reports she got diaphoretic with the pain but denies any nausea.  Patient went to urgent care today and was prompted to go to the emergency department for abnormal EKG.  Patient denies any personal cardiac history.  She denies any family cardiac history.  Patient reports she is not a current smoker and denies any cocaine use.  She denies any history of high blood pressure or diabetes. She denies any OCP use, recent immobilization, prior history of DVT/PE, recent surgery, leg swelling, or long travel.   ROS:  chest Physical Exam:   Gen: No distress  Neuro: Awake and Alert  Skin: Warm    Focused Exam: 2+ radial pulses bilaterallly, lungs clear to auscultation bilaterally.  No evidence of respiratory distress.   Initiation of care has begun. The patient has been counseled on the process, plan, and necessity for staying for the completion/evaluation, and the remainder of the medical screening examination    Maxwell CaulLayden, Lindsey A, PA-C 05/30/18 1438    Pricilla LovelessGoldston, Scott, MD 06/02/18 1203

## 2018-05-30 NOTE — Discharge Instructions (Addendum)
Follow-up as soon as possible with a primary care doctor.  Return here as needed.  Avoid heavy lifting over the next 7 to 10 days.

## 2018-06-05 NOTE — ED Provider Notes (Addendum)
[name] EMERGENCY DEPARTMENT Provider Note   CSN: [id number] Arrival date & time: 05/30/18  1423     History   Chief Complaint Chief Complaint  Patient presents with  . Chest Pain    HPI Stephanie Evans is a 32 y.o. female.  HPI Patient presents to the emergency department with left-sided chest pain began 2 days ago.  The patient states is been constant.  Patient states there is some increasing pain with coughing and deep inspiration.  Patient states that when she leans forward the pain does get worse.  Patient states that nothing seemed to make the condition better.  She did not take any medications prior to arrival.  Patient states she was seen at urgent care and they were concerned that she may have an abnormal EKG.  The patient denies  shortness of breath, headache,blurred vision, neck pain, fever, cough, weakness, numbness, dizziness, anorexia, edema, abdominal pain, nausea, vomiting, diarrhea, rash, back pain, dysuria, hematemesis, bloody stool, near syncope, or syncope. History reviewed. No pertinent past medical history.  There are no active problems to display for this patient.   History reviewed. No pertinent surgical history.   OB History    Gravida  2   Para  1   Term  1   Preterm  0   AB  0   Living  1     SAB  0   TAB  0   Ectopic  0   Multiple  0   Live Births  1            Home Medications    Prior to Admission medications   Medication Sig Start Date End Date Taking? Authorizing Provider  ibuprofen (ADVIL,MOTRIN) 800 MG tablet Take 1 tablet (800 mg total) by mouth every 8 (eight) hours as needed. 05/30/18   Livianna Petraglia, Cristal Deer, PA-C  prenatal vitamin w/FE, FA (PRENATAL 1 + 1) 27-1 MG TABS tablet Take 1 tablet by mouth daily at 12 noon. Patient not taking: Reported on 05/30/2018 03/24/14   Minta Balsam, MD    Family History Family History  Problem Relation Age of Onset  . Cancer Mother        breast   .  Hypertension Father   . Diabetes Father     Social History Social History   Tobacco Use  . Smoking status: Never Smoker  . Smokeless tobacco: Never Used  Substance Use Topics  . Alcohol use: No  . Drug use: No     Allergies   Patient has no known allergies.   Review of Systems Review of Systems All other systems negative except as documented in the HPI. All pertinent positives and negatives as reviewed in the HPI.  Physical Exam Updated Vital Signs BP (!) 166/109 (BP Location: Right Arm)   Pulse 60   Temp 98.4 F (36.9 C) (Oral)   Resp 19   LMP 05/26/2018   SpO2 100%   Physical Exam  Constitutional: She is oriented to person, place, and time. She appears well-developed and well-nourished. No distress.  HENT:  Head: Normocephalic and atraumatic.  Mouth/Throat: Oropharynx is clear and moist.  Eyes: Pupils are equal, round, and reactive to light.  Neck: Normal range of motion. Neck supple.  Cardiovascular: Normal rate, regular rhythm and normal heart sounds. Exam reveals no gallop and no friction rub.  No murmur heard. Pulmonary/Chest: Effort normal and breath sounds normal. No respiratory distress. She has no wheezes. She has no rhonchi. She  has no rales. She exhibits tenderness.    Abdominal: Soft. Bowel sounds are normal. She exhibits no distension. There is no tenderness.  Neurological: She is alert and oriented to person, place, and time. She exhibits normal muscle tone. Coordination normal.  Skin: Skin is warm and dry. Capillary refill takes less than 2 seconds. No rash noted. No erythema.  Psychiatric: She has a normal mood and affect. Her behavior is normal.  Nursing note and vitals reviewed.    ED Treatments / Results  Labs (all labs ordered are listed, but only abnormal results are displayed) Labs Reviewed  BASIC METABOLIC PANEL - Abnormal; Notable for the following components:      Result Value   Potassium 3.4 (*)    All other components within  normal limits  CBC WITH DIFFERENTIAL/PLATELET  I-STAT BETA HCG BLOOD, ED ([facility], [facility], [facility] ONLY)  I-STAT TROPONIN, ED  I-STAT TROPONIN, ED    EKG None ED ECG REPORT   Date: 07/03/2018  Rate:58  Rhythm: normal sinus rhythm  QRS Axis: normal  Intervals: normal  ST/T Wave abnormalities: normal  Conduction Disutrbances:none  Narrative Interpretation:   Old EKG Reviewed: none available  I have personally reviewed the EKG tracing and agree with the computerized printout as noted.   Radiology No results found.  Procedures Procedures (including critical care time)  Medications Ordered in ED Medications - No data to display   Initial Impression / Assessment and Plan / ED Course  I have reviewed the triage vital signs and the nursing notes.  Pertinent labs & imaging results that were available during my care of the patient were reviewed by me and considered in my medical decision making (see chart for details).     The patient's pain is been constant for 2 days.  And certain movements and position make the pain worse.  I feel that this would be atypical for cardiac chest pain and do feel that she most likely has chest wall irritation based on physical exam findings along with her laboratory testing and x-rays.  Patient is advised to return here for any worsening her condition patient agrees the plan and all questions were answered.  Patient low risk based on Wells criteria and PERC negative.  Final Clinical Impressions(s) / ED Diagnoses   Final diagnoses:  Chest wall pain    ED Discharge Orders        Ordered    ibuprofen (ADVIL,MOTRIN) 800 MG tablet  Every 8 hours PRN     05/30/18 2149       Charlestine NightLawyer, Suhail Peloquin, PA-C 06/05/18 2334    Lorre NickAllen, Anthony, MD 06/06/18 1239    Sareena Odeh, Belmonthristopher, PA-C 07/03/18 [phone]    Lorre NickAllen, Anthony, MD 07/03/18 [phone]

## 2020-06-18 ENCOUNTER — Encounter (HOSPITAL_BASED_OUTPATIENT_CLINIC_OR_DEPARTMENT_OTHER): Payer: Self-pay

## 2020-06-18 ENCOUNTER — Emergency Department (HOSPITAL_BASED_OUTPATIENT_CLINIC_OR_DEPARTMENT_OTHER)
Admission: EM | Admit: 2020-06-18 | Discharge: 2020-06-18 | Disposition: A | Discharge status: Home / Self Care | Payer: BC Managed Care – PPO | Attending: Emergency Medicine | Admitting: Emergency Medicine

## 2020-06-18 ENCOUNTER — Other Ambulatory Visit: Payer: Self-pay

## 2020-06-18 ENCOUNTER — Emergency Department (HOSPITAL_BASED_OUTPATIENT_CLINIC_OR_DEPARTMENT_OTHER): Payer: BC Managed Care – PPO

## 2020-06-18 DIAGNOSIS — I10 Essential (primary) hypertension: Secondary | ICD-10-CM | POA: Diagnosis present

## 2020-06-18 DIAGNOSIS — R079 Chest pain, unspecified: Secondary | ICD-10-CM | POA: Insufficient documentation

## 2020-06-18 HISTORY — DX: Essential (primary) hypertension: I10

## 2020-06-18 LAB — CBC
HCT: 45.7 % (ref 36.0–46.0)
Hemoglobin: 14.8 g/dL (ref 12.0–15.0)
MCH: 28.6 pg (ref 26.0–34.0)
MCHC: 32.4 g/dL (ref 30.0–36.0)
MCV: 88.4 fL (ref 80.0–100.0)
Platelets: 208 10*3/uL (ref 150–400)
RBC: 5.17 MIL/uL — ABNORMAL HIGH (ref 3.87–5.11)
RDW: 12.7 % (ref 11.5–15.5)
WBC: 5.1 10*3/uL (ref 4.0–10.5)
nRBC: 0 % (ref 0.0–0.2)

## 2020-06-18 LAB — BASIC METABOLIC PANEL
Anion gap: 8 (ref 5–15)
BUN: 12 mg/dL (ref 6–20)
CO2: 24 mmol/L (ref 22–32)
Calcium: 8.4 mg/dL — ABNORMAL LOW (ref 8.9–10.3)
Chloride: 104 mmol/L (ref 98–111)
Creatinine, Ser: 0.87 mg/dL (ref 0.44–1.00)
GFR calc Af Amer: >60 mL/min (ref >60)
GFR calc non Af Amer: >60 mL/min (ref >60)
Glucose, Bld: 94 mg/dL (ref 70–99)
Potassium: 3.3 mmol/L — ABNORMAL LOW (ref 3.5–5.1)
Sodium: 136 mmol/L (ref 135–145)

## 2020-06-18 LAB — TROPONIN I (HIGH SENSITIVITY): Troponin I (High Sensitivity): 3 ng/L (ref <18)

## 2020-06-18 MED ORDER — LISINOPRIL 10 MG PO TABS
10.0000 mg | ORAL_TABLET | Freq: Once | ORAL | Status: AC
  Administered 2020-06-18: 10 mg via ORAL
  Filled 2020-06-18: qty 1

## 2020-06-18 NOTE — Discharge Instructions (Addendum)
Restart your lisinopril prescription.  Your appointment to follow-up with your new primary care doctor in a few weeks.  They may have to adjust your blood pressure medicine.  Return for any new or worse symptoms.  Return for severe headache severe shortness of breath severe chest pain or any strokelike symptoms.  Today's work-up without any significant findings.  But you do have high blood pressure.

## 2020-06-18 NOTE — ED Provider Notes (Signed)
[facility] EMERGENCY DEPARTMENT Provider Note   CSN: [id number] Arrival date & time: 06/18/20  1110     History Chief Complaint  Patient presents with   Hypertension    Stephanie Evans is a 34 y.o. female.  Patient sent in from the oral surgeon office for elevated blood pressure.  Patient states in the past she was admitted to [facility] due to very high blood pressures.  Started on lisinopril.  Has not been taking it but does have a refill left which she has requested to be filled.  And she is planning on retaking that.  She is recently been able to get access to a primary care provider.  Patient denies any chest pain or any shortness of breath.  With the exception may be had a little short lasting sharp pain just recently.  Nothing lasting long period of time.  Patient blood pressure is high here upon arrival 231/131.  Patient denies headache any significant shortness of breath any strokelike symptoms.        Past Medical History:  Diagnosis Date   Hypertension     There are no problems to display for this patient.   History reviewed. No pertinent surgical history.   OB History    Gravida  2   Para  1   Term  1   Preterm  0   AB  0   Living  1     SAB  0   TAB  0   Ectopic  0   Multiple  0   Live Births  1           Family History  Problem Relation Age of Onset   Cancer Mother        breast    Hypertension Father    Diabetes Father     Social History   Tobacco Use   Smoking status: Never Smoker   Smokeless tobacco: Never Used  Vaping Use   Vaping Use: Never used  Substance Use Topics   Alcohol use: Yes    Comment: weekly   Drug use: Yes    Types: Marijuana    Home Medications Prior to Admission medications   Medication Sig Start Date End Date Taking? Authorizing Provider  ibuprofen (ADVIL,MOTRIN) 800 MG tablet Take 1 tablet (800 mg total) by mouth every 8 (eight) hours as needed. 05/30/18   Lawyer,  Cristal Deer, PA-C  prenatal vitamin w/FE, FA (PRENATAL 1 + 1) 27-1 MG TABS tablet Take 1 tablet by mouth daily at 12 noon. Patient not taking: Reported on 05/30/2018 03/24/14   Minta Balsam, MD    Allergies    Patient has no known allergies.  Review of Systems   Review of Systems  Constitutional: Negative for chills and fever.  HENT: Negative for congestion, rhinorrhea and sore throat.   Eyes: Negative for visual disturbance.  Respiratory: Negative for cough and shortness of breath.   Cardiovascular: Positive for chest pain. Negative for leg swelling.  Gastrointestinal: Negative for abdominal pain, diarrhea, nausea and vomiting.  Genitourinary: Negative for dysuria.  Musculoskeletal: Negative for back pain and neck pain.  Skin: Negative for rash.  Neurological: Negative for dizziness, light-headedness and headaches.  Hematological: Does not bruise/bleed easily.  Psychiatric/Behavioral: Negative for confusion.    Physical Exam Updated Vital Signs BP (!) 194/96    Pulse 72    Temp 99.2 F (37.3 C) (Oral)    Resp 18    Ht 1.6 m (5'  3")    Wt 78 kg    SpO2 99%    BMI 30.47 kg/m   Physical Exam Vitals and nursing note reviewed.  Constitutional:      General: She is not in acute distress.    Appearance: Normal appearance. She is well-developed.  HENT:     Head: Normocephalic and atraumatic.     Mouth/Throat:     Mouth: Mucous membranes are moist.  Eyes:     Extraocular Movements: Extraocular movements intact.     Conjunctiva/sclera: Conjunctivae normal.     Pupils: Pupils are equal, round, and reactive to light.  Cardiovascular:     Rate and Rhythm: Normal rate and regular rhythm.     Heart sounds: No murmur heard.   Pulmonary:     Effort: Pulmonary effort is normal. No respiratory distress.     Breath sounds: Normal breath sounds.  Abdominal:     Palpations: Abdomen is soft.     Tenderness: There is no abdominal tenderness.  Musculoskeletal:        General: No  swelling. Normal range of motion.     Cervical back: Normal range of motion and neck supple.  Skin:    General: Skin is warm and dry.     Capillary Refill: Capillary refill takes less than 2 seconds.  Neurological:     General: No focal deficit present.     Mental Status: She is alert and oriented to person, place, and time.     Cranial Nerves: No cranial nerve deficit.     Sensory: No sensory deficit.     ED Results / Procedures / Treatments   Labs (all labs ordered are listed, but only abnormal results are displayed) Labs Reviewed  CBC - Abnormal; Notable for the following components:      Result Value   RBC 5.17 (*)    All other components within normal limits  BASIC METABOLIC PANEL - Abnormal; Notable for the following components:   Potassium 3.3 (*)    Calcium 8.4 (*)    All other components within normal limits  TROPONIN I (HIGH SENSITIVITY)    EKG EKG Interpretation  Date/Time:  Thursday June 18 2020 11:31:23 EDT Ventricular Rate:  66 PR Interval:    QRS Duration: 96 QT Interval:  397 QTC Calculation: 416 R Axis:   8 Text Interpretation: Sinus rhythm Probable left ventricular hypertrophy Borderline T abnormalities, diffuse leads Baseline wander in lead(s) V1 V6 Confirmed by Vanetta Mulders [phone]) on 06/18/2020 11:39:05 AM   Radiology DG Chest 2 View  Result Date: 06/18/2020 CLINICAL DATA:  Chest pain for a few days, hypertension, smoker EXAM: CHEST - 2 VIEW COMPARISON:  08/22/2019 FINDINGS: Normal heart size, mediastinal contours, and pulmonary vascularity. Lungs clear. No pleural effusion or pneumothorax. Bones unremarkable. IMPRESSION: Normal exam. Electronically Signed   By: Ulyses Southward M.D.   On: 06/18/2020 12:36    Procedures Procedures (including critical care time)  Medications Ordered in ED Medications  lisinopril (ZESTRIL) tablet 10 mg (10 mg Oral Given 06/18/20 1214)    ED Course  I have reviewed the triage vital signs and the nursing  notes.  Pertinent labs & imaging results that were available during my care of the patient were reviewed by me and considered in my medical decision making (see chart for details).    MDM Rules/Calculators/A&P  Work-up without any significant findings.  Patient states she used to be on lisinopril and does have a renewal but was not sure on the amount.  Patient given 10 mg of lisinopril here today.  No significant improvement in blood pressure but did improve some.  Latest blood pressure reading was 194/96.  Patient without any endorgan symptoms.  Labs without significant abnormalities.  Chest x-ray negative.  EKG without acute changes and troponin was normal.  Patient needs to restart lisinopril and follow-up with her new primary care doctor for further blood pressure checks.  Patient understands the importance in controlling her blood pressure.  No acute findings requiring admission.  By history patient has had high blood pressure for period of time.  And clearly not well controlled.   Final Clinical Impression(s) / ED Diagnoses Final diagnoses:  Essential hypertension    Rx / DC Orders ED Discharge Orders    None       Vanetta Mulders, MD 06/18/20 1445

## 2020-06-18 NOTE — ED Triage Notes (Signed)
Pt reports she was sent from oral surgeon office due to elevated BP-hx of noncompliant HTN-NAD-steady gait

## 2021-09-26 ENCOUNTER — Emergency Department (HOSPITAL_COMMUNITY)
Admission: EM | Admit: 2021-09-26 | Discharge: 2021-09-26 | Disposition: A | Discharge status: Home / Self Care | Payer: Self-pay | Attending: Emergency Medicine | Admitting: Emergency Medicine

## 2021-09-26 ENCOUNTER — Emergency Department (HOSPITAL_COMMUNITY): Payer: Self-pay

## 2021-09-26 ENCOUNTER — Other Ambulatory Visit: Payer: Self-pay

## 2021-09-26 ENCOUNTER — Encounter (HOSPITAL_COMMUNITY): Payer: Self-pay | Admitting: Emergency Medicine

## 2021-09-26 DIAGNOSIS — R55 Syncope and collapse: Secondary | ICD-10-CM | POA: Insufficient documentation

## 2021-09-26 DIAGNOSIS — R7989 Other specified abnormal findings of blood chemistry: Secondary | ICD-10-CM

## 2021-09-26 DIAGNOSIS — I10 Essential (primary) hypertension: Secondary | ICD-10-CM | POA: Insufficient documentation

## 2021-09-26 DIAGNOSIS — R0789 Other chest pain: Secondary | ICD-10-CM | POA: Insufficient documentation

## 2021-09-26 DIAGNOSIS — E876 Hypokalemia: Secondary | ICD-10-CM | POA: Insufficient documentation

## 2021-09-26 DIAGNOSIS — R109 Unspecified abdominal pain: Secondary | ICD-10-CM | POA: Insufficient documentation

## 2021-09-26 LAB — URINALYSIS, ROUTINE W REFLEX MICROSCOPIC
Bilirubin Urine: NEGATIVE
Glucose, UA: NEGATIVE mg/dL
Hgb urine dipstick: NEGATIVE
Ketones, ur: 20 mg/dL — AB
Nitrite: NEGATIVE
Protein, ur: NEGATIVE mg/dL
Specific Gravity, Urine: 1.015 (ref 1.005–1.030)
pH: 5 (ref 5.0–8.0)

## 2021-09-26 LAB — CBC WITH DIFFERENTIAL/PLATELET
Abs Immature Granulocytes: 0.02 10*3/uL (ref 0.00–0.07)
Basophils Absolute: 0 10*3/uL (ref 0.0–0.1)
Basophils Relative: 0 %
Eosinophils Absolute: 0 10*3/uL (ref 0.0–0.5)
Eosinophils Relative: 0 %
HCT: 51.4 % — ABNORMAL HIGH (ref 36.0–46.0)
Hemoglobin: 17 g/dL — ABNORMAL HIGH (ref 12.0–15.0)
Immature Granulocytes: 0 %
Lymphocytes Relative: 25 %
Lymphs Abs: 1.6 10*3/uL (ref 0.7–4.0)
MCH: 29.1 pg (ref 26.0–34.0)
MCHC: 33.1 g/dL (ref 30.0–36.0)
MCV: 87.9 fL (ref 80.0–100.0)
Monocytes Absolute: 0.4 10*3/uL (ref 0.1–1.0)
Monocytes Relative: 7 %
Neutro Abs: 4.1 10*3/uL (ref 1.7–7.7)
Neutrophils Relative %: 68 %
Platelets: 245 10*3/uL (ref 150–400)
RBC: 5.85 MIL/uL — ABNORMAL HIGH (ref 3.87–5.11)
RDW: 12.6 % (ref 11.5–15.5)
WBC: 6.2 10*3/uL (ref 4.0–10.5)
nRBC: 0 % (ref 0.0–0.2)

## 2021-09-26 LAB — COMPREHENSIVE METABOLIC PANEL
ALT: 12 U/L (ref 0–44)
AST: 14 U/L — ABNORMAL LOW (ref 15–41)
Albumin: 4.2 g/dL (ref 3.5–5.0)
Alkaline Phosphatase: 44 U/L (ref 38–126)
Anion gap: 14 (ref 5–15)
BUN: 21 mg/dL — ABNORMAL HIGH (ref 6–20)
CO2: 26 mmol/L (ref 22–32)
Calcium: 9.6 mg/dL (ref 8.9–10.3)
Chloride: 95 mmol/L — ABNORMAL LOW (ref 98–111)
Creatinine, Ser: 1.5 mg/dL — ABNORMAL HIGH (ref 0.44–1.00)
GFR, Estimated: 47 mL/min — ABNORMAL LOW (ref >60)
Glucose, Bld: 89 mg/dL (ref 70–99)
Potassium: 3.1 mmol/L — ABNORMAL LOW (ref 3.5–5.1)
Sodium: 135 mmol/L (ref 135–145)
Total Bilirubin: 1.7 mg/dL — ABNORMAL HIGH (ref 0.3–1.2)
Total Protein: 7.4 g/dL (ref 6.5–8.1)

## 2021-09-26 LAB — MAGNESIUM: Magnesium: 2.4 mg/dL (ref 1.7–2.4)

## 2021-09-26 LAB — HCG, QUANTITATIVE, PREGNANCY: hCG, Beta Chain, Quant, S: <1 m[IU]/mL (ref <5)

## 2021-09-26 LAB — TROPONIN I (HIGH SENSITIVITY)
Troponin I (High Sensitivity): 21 ng/L — ABNORMAL HIGH (ref <18)
Troponin I (High Sensitivity): 23 ng/L — ABNORMAL HIGH (ref <18)

## 2021-09-26 LAB — D-DIMER, QUANTITATIVE: D-Dimer, Quant: 0.64 ug/mL-FEU — ABNORMAL HIGH (ref 0.00–0.50)

## 2021-09-26 MED ORDER — POTASSIUM CHLORIDE CRYS ER 20 MEQ PO TBCR
40.0000 meq | EXTENDED_RELEASE_TABLET | Freq: Once | ORAL | Status: AC
  Administered 2021-09-26: 40 meq via ORAL
  Filled 2021-09-26: qty 2

## 2021-09-26 MED ORDER — IOHEXOL 350 MG/ML SOLN
50.0000 mL | Freq: Once | INTRAVENOUS | Status: AC | PRN
  Administered 2021-09-26: 50 mL via INTRAVENOUS

## 2021-09-26 MED ORDER — LACTATED RINGERS IV BOLUS
1000.0000 mL | Freq: Once | INTRAVENOUS | Status: AC
  Administered 2021-09-26: 1000 mL via INTRAVENOUS

## 2021-09-26 NOTE — ED Provider Notes (Signed)
Emergency Medicine Provider Triage Evaluation Note  Stephanie Evans , a 35 y.o. female  was evaluated in triage.  Pt complains of loss of consciousness.  Had a syncopal episode while in her front yard today that was witnessed by her sister.  Recent hospitalization Stephanie Evans of [place] due to elevated blood pressure, was placed on cardiac drip according to patient and had blood pressure lower.  Also has had some worsening headaches recently, laced on lisinopril for outpatient pain management.  She reports pain along the chest, abdomen, foot after her fall.  Review of Systems  Positive: Headache, chest pain, shortness of breath Negative: Nausea, vomiting  Physical Exam  BP (!) 181/108 (BP Location: Right Arm)   Pulse 66   Temp 98.4 F (36.9 C) (Oral)   Resp 18   LMP 09/06/2021   SpO2 100%  Gen:   Awake, no distress   Resp:  Normal effort  MSK:   Moves extremities without difficulty  Other:    Medical Decision Making  Medically screening exam initiated at 1:27 PM.  Appropriate orders placed.  Stephanie Evans was informed that the remainder of the evaluation will be completed by another provider, this initial triage assessment does not replace that evaluation, and the importance of remaining in the ED until their evaluation is complete.     Stephanie Manges, PA-C 09/26/21 1329    Stephanie Munch, MD 09/26/21 [phone]

## 2021-09-26 NOTE — Discharge Instructions (Signed)
Please begin taking your hypertension medications immediately.  Below is the contact information for [facility] and wellness.  Please give them a call to schedule an appointment for reevaluation.  If you develop any new or worsening symptoms please come back to the emergency department immediately.  It was a pleasure to meet you.

## 2021-09-26 NOTE — ED Provider Notes (Signed)
[name] EMERGENCY DEPARTMENT Provider Note   CSN: [id number] Arrival date & time: 09/26/21  1234     History Chief Complaint  Patient presents with   Loss of Consciousness    Stephanie Evans is a 35 y.o. female.  HPI Patient is a 35 year old female with a history of hypertension who presents to the emergency department due to LOC.  Patient states that 2 days ago she was experiencing left-sided chest pain, left-sided abdominal pain, nausea, headaches, blurry vision and went to the emergency department in [facility] and was found to have an elevated blood pressure.  She states she typically takes lisinopril for this and has been compliant with her medications.  She states she was admitted overnight and started on IV antihypertensives.  Patient notes during this admission that she had a syncopal episode.  She was discharged yesterday with a prescription for lisinopril HCTZ 10-12.5.  She states she has been unable to fill this over the weekend so she continued taking her regular lisinopril.  She states that her abdominal pain has been intermittent but is currently resolved.  She does note some left-sided chest heaviness.  She was walking through her front door into her yard just prior to arrival and had a brief syncopal episode and fell forward on the concrete.  Her sister witnessed this.  She stood up quickly and actually did not realize that she syncopized.  Before 2 days ago she denies a history of syncopal episodes.  Denies any numbness/weakness.    Past Medical History:  Diagnosis Date   Hypertension     There are no problems to display for this patient.   History reviewed. No pertinent surgical history.   OB History     Gravida  2   Para  1   Term  1   Preterm  0   AB  0   Living  1      SAB  0   IAB  0   Ectopic  0   Multiple  0   Live Births  1           Family History  Problem Relation Age of Onset   Cancer Mother         breast    Hypertension Father    Diabetes Father     Social History   Tobacco Use   Smoking status: Never   Smokeless tobacco: Never  Vaping Use   Vaping Use: Never used  Substance Use Topics   Alcohol use: Yes    Comment: weekly   Drug use: Yes    Types: Marijuana    Home Medications Prior to Admission medications   Medication Sig Start Date End Date Taking? Authorizing Provider  ibuprofen (ADVIL,MOTRIN) 800 MG tablet Take 1 tablet (800 mg total) by mouth every 8 (eight) hours as needed. 05/30/18   Lawyer, Cristal Deer, PA-C  prenatal vitamin w/FE, FA (PRENATAL 1 + 1) 27-1 MG TABS tablet Take 1 tablet by mouth daily at 12 noon. Patient not taking: Reported on 05/30/2018 03/24/14   Minta Balsam, MD    Allergies    Patient has no known allergies.  Review of Systems   Review of Systems  All other systems reviewed and are negative. Ten systems reviewed and are negative for acute change, except as noted in the HPI.   Physical Exam Updated Vital Signs BP (!) 194/108   Pulse (!) 57   Temp 98.8 F (37.1 C) (Oral)  Resp 17   Ht 5\' 3"  (1.6 m)   Wt 72.1 kg   LMP 09/06/2021   SpO2 99%   BMI 28.17 kg/m   Physical Exam Vitals and nursing note reviewed.  Constitutional:      General: She is not in acute distress.    Appearance: Normal appearance. She is not ill-appearing, toxic-appearing or diaphoretic.  HENT:     Head: Normocephalic and atraumatic.     Right Ear: External ear normal.     Left Ear: External ear normal.     Nose: Nose normal.     Mouth/Throat:     Mouth: Mucous membranes are moist.     Pharynx: Oropharynx is clear. No oropharyngeal exudate or posterior oropharyngeal erythema.  Eyes:     General: No scleral icterus.       Right eye: No discharge.        Left eye: No discharge.     Extraocular Movements: Extraocular movements intact.     Conjunctiva/sclera: Conjunctivae normal.     Pupils: Pupils are equal, round, and reactive to light.   Cardiovascular:     Rate and Rhythm: Normal rate and regular rhythm.     Pulses: Normal pulses.     Heart sounds: Normal heart sounds. No murmur heard.   No friction rub. No gallop.  Pulmonary:     Effort: Pulmonary effort is normal. No respiratory distress.     Breath sounds: Normal breath sounds. No stridor. No wheezing, rhonchi or rales.  Chest:     Chest wall: No tenderness.  Abdominal:     General: Abdomen is flat.     Palpations: Abdomen is soft.     Tenderness: There is no abdominal tenderness.  Musculoskeletal:        General: Normal range of motion.     Cervical back: Normal range of motion and neck supple. No tenderness.  Skin:    General: Skin is warm and dry.  Neurological:     General: No focal deficit present.     Mental Status: She is alert and oriented to person, place, and time.     Comments: Patient is oriented to person, place, and time. Patient phonates in clear, complete, and coherent sentences. Negative arm drift. Finger to nose intact bilaterally with no visible signs of dysmetria. Strength is 5/5 in all four extremities. Distal sensation intact in all four extremities.  Psychiatric:        Mood and Affect: Mood normal.        Behavior: Behavior normal.   ED Results / Procedures / Treatments   Labs (all labs ordered are listed, but only abnormal results are displayed) Labs Reviewed  CBC WITH DIFFERENTIAL/PLATELET - Abnormal; Notable for the following components:      Result Value   RBC 5.85 (*)    Hemoglobin 17.0 (*)    HCT 51.4 (*)    All other components within normal limits  COMPREHENSIVE METABOLIC PANEL - Abnormal; Notable for the following components:   Potassium 3.1 (*)    Chloride 95 (*)    BUN 21 (*)    Creatinine, Ser 1.50 (*)    AST 14 (*)    Total Bilirubin 1.7 (*)    GFR, Estimated 47 (*)    All other components within normal limits  URINALYSIS, ROUTINE W REFLEX MICROSCOPIC - Abnormal; Notable for the following components:    APPearance CLOUDY (*)    Ketones, ur 20 (*)    Leukocytes,Ua LARGE (*)  Bacteria, UA RARE (*)    All other components within normal limits  D-DIMER, QUANTITATIVE - Abnormal; Notable for the following components:   D-Dimer, Quant 0.64 (*)    All other components within normal limits  TROPONIN I (HIGH SENSITIVITY) - Abnormal; Notable for the following components:   Troponin I (High Sensitivity) 21 (*)    All other components within normal limits  TROPONIN I (HIGH SENSITIVITY) - Abnormal; Notable for the following components:   Troponin I (High Sensitivity) 23 (*)    All other components within normal limits  HCG, QUANTITATIVE, PREGNANCY  MAGNESIUM   EKG EKG Interpretation  Date/Time:  Sunday September 26 2021 13:05:38 EDT Ventricular Rate:  63 PR Interval:  152 QRS Duration: 88 QT Interval:  390 QTC Calculation: 399 R Axis:   -24 Text Interpretation: Normal sinus rhythm Left ventricular hypertrophy with repolarization abnormality ( R in aVL , Cornell product ) Cannot rule out Septal infarct , age undetermined Abnormal ECG Confirmed by Kristine Royal [phone]) on 09/26/2021 5:31:16 PM  Radiology DG Chest 2 View  Result Date: 09/26/2021 CLINICAL DATA:  Syncope. EXAM: CHEST - 2 VIEW COMPARISON:  September 24, 2021 FINDINGS: The heart size and mediastinal contours are within normal limits. Both lungs are clear. The visualized skeletal structures are unremarkable. IMPRESSION: No active cardiopulmonary disease. Electronically Signed   By: Sherian Rein M.D.   On: 09/26/2021 13:41   CT HEAD WO CONTRAST ( )  Result Date: 09/26/2021 CLINICAL DATA:  Syncope, simple, normal neuro exam. EXAM: CT HEAD WITHOUT CONTRAST TECHNIQUE: Contiguous axial images were obtained from the base of the skull through the vertex without intravenous contrast. COMPARISON:  None. FINDINGS: Brain: Normal anatomic configuration. No abnormal intra or extra-axial mass lesion or fluid collection. No abnormal mass effect or  midline shift. No evidence of acute intracranial hemorrhage or infarct. Ventricular size is normal. Cerebellum unremarkable. Vascular: Unremarkable Skull: Intact Sinuses/Orbits: Paranasal sinuses are clear. Orbits are unremarkable. Other: Mastoid air cells and middle ear cavities are clear. IMPRESSION: No acute intracranial abnormality. Electronically Signed   By: Helyn Numbers M.D.   On: 09/26/2021 21:34   CT Angio Chest PE W and/or Wo Contrast  Result Date: 09/26/2021 CLINICAL DATA:  PE suspected, high prob.  Syncope, chest pain. EXAM: CT ANGIOGRAPHY CHEST WITH CONTRAST TECHNIQUE: Multidetector CT imaging of the chest was performed using the standard protocol during bolus administration of intravenous contrast. Multiplanar CT image reconstructions and MIPs were obtained to evaluate the vascular anatomy. CONTRAST:  23mL OMNIPAQUE IOHEXOL 350 MG/ML SOLN COMPARISON:  None. FINDINGS: Cardiovascular: Satisfactory opacification of the pulmonary arteries to the segmental level. No evidence of pulmonary embolism. Normal heart size. No pericardial effusion. Mediastinum/Nodes: No enlarged mediastinal, hilar, or axillary lymph nodes. Thyroid gland, trachea, and esophagus demonstrate no significant findings. Lungs/Pleura: Lungs are clear. No pleural effusion or pneumothorax. Upper Abdomen: No acute abnormality. Musculoskeletal: No chest wall abnormality. No acute or significant osseous findings. Review of the MIP images confirms the above findings. IMPRESSION: No pulmonary embolism. No radiographic evidence of acute cardiopulmonary disease. Electronically Signed   By: Helyn Numbers M.D.   On: 09/26/2021 21:42    Procedures Procedures   Medications Ordered in ED Medications  lactated ringers bolus 1,000 mL (0 mLs Intravenous Stopped 09/26/21 1858)  potassium chloride SA (KLOR-CON) CR tablet 40 mEq (40 mEq Oral Given 09/26/21 1913)  iohexol (OMNIPAQUE) 350 MG/ML injection 50 mL (50 mLs Intravenous Contrast Given  09/26/21 2122)   ED Course  I have  reviewed the triage vital signs and the nursing notes.  Pertinent labs & imaging results that were available during my care of the patient were reviewed by me and considered in my medical decision making (see chart for details).  Clinical Course as of 09/26/21 2239  Wynelle Link Sep 26, 2021  2237 Comprehensive metabolic panel(!) Possible AKI.  Likely prerenal. [LJ]  2237 CBC with Differential(!) Likely hemoconcentration.  Patient given 1 L of IV fluids. [LJ]    Clinical Course User Index [LJ] Placido Sou, PA-C   MDM Rules/Calculators/A&P                          Pt is a 35 y.o. female who presents to the emergency department due to LOC that occurred prior to arrival.  Her friend at bedside states she witnessed her had a brief syncopal episode in front of her home.  Labs: CBC with elevated RBCs of 5.85, hemoglobin of 17, hematocrit of 51.4.  Possibly hemoconcentration.  Patient given 1 L of lactated Ringer's. CMP with a potassium of 3.1, chloride of 95, BUN of 21, creatinine of 1.5, AST of 14, total bilirubin of 1.7, GFR of 47. UA with 20 ketones, large leukocytes, rare bacteria. Elevated D-dimer at 0.64. Magnesium 2.4. Quantitative hCG less than 1. Troponin of 21 with a repeat of 23.  Imaging: Chest x-ray shows no active cardiopulmonary disease. CT scan of the head without contrast shows no acute intracranial abnormalities. CTA of the chest shows no pulmonary embolism.  No radiographic evidence of acute cardiopulmonary disease.  I, Placido Sou, PA-C, personally reviewed and evaluated these images and lab results as part of my medical decision-making.  Unsure the source of the patient's symptoms.  CT scan of the head is negative.  Neurological exam appears benign at this time.  I obtained a D-dimer as well as troponins.  D-dimer mildly elevated and CTA was obtained which was negative.  Troponins mildly elevated at 21 and 23 but flat.  Doubt ACS  at this time.  Patient recently admitted to [facility] and states she was discharged yesterday and started on lisinopril/HCTZ which she has not began taking.  Urged patient to fill this prescription and begin taking it immediately.  Feel that she is stable for discharge at this time and she is agreeable.  She does not have a PCP or health insurance so she was given a referral to [facility] and wellness.  We discussed return precautions at length.  Her questions were answered and she was amicable at the time of discharge.  Note: Portions of this report may have been transcribed using voice recognition software. Every effort was made to ensure accuracy; however, inadvertent computerized transcription errors may be present.   Final Clinical Impression(s) / ED Diagnoses Final diagnoses:  Atypical chest pain  Hypokalemia  Syncope, unspecified syncope type  Positive D dimer   Rx / DC Orders ED Discharge Orders     None        Placido Sou, PA-C 09/26/21 2243    Wynetta Fines, MD 09/27/21 2314

## 2021-09-26 NOTE — ED Triage Notes (Signed)
Pt to triage via [facility].  She had a syncopal episode on Friday and seen at [facility].  Another syncopal episode today.  Denies pain.  Used to take BP medication but was taken off of it after weight loss.  Started back on BP medication on Friday but hasn't gotten Rx filled yet.

## 2021-11-30 ENCOUNTER — Ambulatory Visit: Payer: Self-pay | Admitting: Physician Assistant

## 2022-04-11 ENCOUNTER — Encounter (HOSPITAL_COMMUNITY): Payer: Self-pay | Admitting: Emergency Medicine

## 2022-04-11 ENCOUNTER — Emergency Department (HOSPITAL_COMMUNITY)
Admission: EM | Admit: 2022-04-11 | Discharge: 2022-04-11 | Disposition: A | Discharge status: Home / Self Care | Payer: Managed Care, Other (non HMO) | Attending: Emergency Medicine | Admitting: Emergency Medicine

## 2022-04-11 ENCOUNTER — Other Ambulatory Visit: Payer: Self-pay

## 2022-04-11 DIAGNOSIS — I10 Essential (primary) hypertension: Secondary | ICD-10-CM | POA: Diagnosis present

## 2022-04-11 DIAGNOSIS — E876 Hypokalemia: Secondary | ICD-10-CM | POA: Insufficient documentation

## 2022-04-11 DIAGNOSIS — I159 Secondary hypertension, unspecified: Secondary | ICD-10-CM | POA: Diagnosis not present

## 2022-04-11 DIAGNOSIS — R944 Abnormal results of kidney function studies: Secondary | ICD-10-CM | POA: Insufficient documentation

## 2022-04-11 DIAGNOSIS — Z79899 Other long term (current) drug therapy: Secondary | ICD-10-CM | POA: Insufficient documentation

## 2022-04-11 LAB — URINALYSIS, ROUTINE W REFLEX MICROSCOPIC
Bacteria, UA: NONE SEEN
Bilirubin Urine: NEGATIVE
Glucose, UA: 50 mg/dL — AB
Hgb urine dipstick: NEGATIVE
Ketones, ur: NEGATIVE mg/dL
Leukocytes,Ua: NEGATIVE
Nitrite: NEGATIVE
Protein, ur: 30 mg/dL — AB
Specific Gravity, Urine: 1.014 (ref 1.005–1.030)
pH: 7 (ref 5.0–8.0)

## 2022-04-11 LAB — CBC WITH DIFFERENTIAL/PLATELET
Abs Immature Granulocytes: 0.02 10*3/uL (ref 0.00–0.07)
Basophils Absolute: 0 10*3/uL (ref 0.0–0.1)
Basophils Relative: 0 %
Eosinophils Absolute: 0 10*3/uL (ref 0.0–0.5)
Eosinophils Relative: 0 %
HCT: 45.3 % (ref 36.0–46.0)
Hemoglobin: 14.9 g/dL (ref 12.0–15.0)
Immature Granulocytes: 0 %
Lymphocytes Relative: 12 %
Lymphs Abs: 0.7 10*3/uL (ref 0.7–4.0)
MCH: 29.1 pg (ref 26.0–34.0)
MCHC: 32.9 g/dL (ref 30.0–36.0)
MCV: 88.5 fL (ref 80.0–100.0)
Monocytes Absolute: 0.1 10*3/uL (ref 0.1–1.0)
Monocytes Relative: 2 %
Neutro Abs: 5.3 10*3/uL (ref 1.7–7.7)
Neutrophils Relative %: 86 %
Platelets: 213 10*3/uL (ref 150–400)
RBC: 5.12 MIL/uL — ABNORMAL HIGH (ref 3.87–5.11)
RDW: 12.8 % (ref 11.5–15.5)
WBC: 6.2 10*3/uL (ref 4.0–10.5)
nRBC: 0 % (ref 0.0–0.2)

## 2022-04-11 LAB — BASIC METABOLIC PANEL
Anion gap: 6 (ref 5–15)
BUN: 14 mg/dL (ref 6–20)
CO2: 27 mmol/L (ref 22–32)
Calcium: 8.7 mg/dL — ABNORMAL LOW (ref 8.9–10.3)
Chloride: 105 mmol/L (ref 98–111)
Creatinine, Ser: 1.29 mg/dL — ABNORMAL HIGH (ref 0.44–1.00)
GFR, Estimated: 56 mL/min — ABNORMAL LOW (ref >60)
Glucose, Bld: 105 mg/dL — ABNORMAL HIGH (ref 70–99)
Potassium: 3.1 mmol/L — ABNORMAL LOW (ref 3.5–5.1)
Sodium: 138 mmol/L (ref 135–145)

## 2022-04-11 LAB — TROPONIN I (HIGH SENSITIVITY)
Troponin I (High Sensitivity): 9 ng/L (ref <18)
Troponin I (High Sensitivity): 9 ng/L (ref <18)

## 2022-04-11 LAB — PREGNANCY, URINE: Preg Test, Ur: NEGATIVE

## 2022-04-11 MED ORDER — AMLODIPINE BESYLATE 5 MG PO TABS: 5.0000 mg | ORAL_TABLET | Freq: Every day | ORAL | 0 refills | Status: DC

## 2022-04-11 MED ORDER — AMLODIPINE BESYLATE 5 MG PO TABS
5.0000 mg | ORAL_TABLET | Freq: Once | ORAL | Status: AC
  Administered 2022-04-11: 5 mg via ORAL
  Filled 2022-04-11: qty 1

## 2022-04-11 MED ORDER — POTASSIUM CHLORIDE CRYS ER 20 MEQ PO TBCR
40.0000 meq | EXTENDED_RELEASE_TABLET | Freq: Once | ORAL | Status: AC
  Administered 2022-04-11: 40 meq via ORAL
  Filled 2022-04-11: qty 2

## 2022-04-11 NOTE — Discharge Instructions (Addendum)
You have been evaluated for high blood pressure.  Your work-up was grossly unremarkable. ? ?Please establish a primary care provider to get further work-up.  I have given you information on how to establish a primary care provider. ? ?In addition, we will start you on amlodipine 5 mg until you establish care and get seen by primary care provider. ? ?If you have worsening symptoms, worsening headache, fever, burly vision, or desire to be reevaluated please come back to the emergency department. ?

## 2022-04-11 NOTE — ED Provider Notes (Signed)
?[name] ?Provider Note ? ? ?CSN: [id number] ?Arrival date & time: 04/11/22  1329 ? ?  ? ?History ? ?Chief Complaint  ?Patient presents with  ? Hypertension  ? ? ?Stephanie KaufmanDarnesa Evans is a 36 y.o. female. ? ? ?Hypertension ? ?Patient is a 36 year old female with a history of hypertension not on any medication who presents to the emergency department for elevated blood pressure.  She reports she had a dental extraction today.  Her blood pressure was elevated before the extraction.  After her dental extraction, her blood pressure continue be elevated.  She reports she did have a history of hypertension and was started on lisinopril with hydrochlorothiazide.  However she did have side effects including hypotension that she stopped it immediately.  She currently denies severe headache, blurry vision, focal weakness.  Denies associated chest pain or shortness of breath.  Denies abdominal pain, nausea, vomiting, or diarrhea.  She reports she is active and does CrossFit.  Otherwise no other complaints. ? ?Home Medications ?Prior to Admission medications   ?Medication Sig Start Date End Date Taking? Authorizing Provider  ?amLODipine (NORVASC) 5 MG tablet Take 1 tablet (5 mg total) by mouth daily. 04/11/22  Yes Jari SportsmanWodajo, Joleah Kosak T, MD  ?ibuprofen (ADVIL,MOTRIN) 800 MG tablet Take 1 tablet (800 mg total) by mouth every 8 (eight) hours as needed. 05/30/18   Lawyer, Cristal Deerhristopher, PA-C  ?prenatal vitamin w/FE, FA (PRENATAL 1 + 1) 27-1 MG TABS tablet Take 1 tablet by mouth daily at 12 noon. ?Patient not taking: Reported on 05/30/2018 03/24/14   Minta Balsamdom, Michael R, MD  ?   ? ?Allergies    ?Patient has no known allergies.   ? ?Review of Systems   ?Review of Systems ? ?Physical Exam ?Updated Vital Signs ?BP (!) 239/161   Pulse 67   Temp 97.6 ?F (36.4 ?C)   Resp (!) 23   SpO2 99%  ?Physical Exam ?Vitals and nursing note reviewed.  ?Constitutional:   ?   General: She is not in acute distress. ?    Appearance: She is well-developed. She is not ill-appearing.  ?HENT:  ?   Head: Normocephalic and atraumatic.  ?Eyes:  ?   Conjunctiva/sclera: Conjunctivae normal.  ?Cardiovascular:  ?   Rate and Rhythm: Normal rate and regular rhythm.  ?   Heart sounds: No murmur heard. ?Pulmonary:  ?   Effort: Pulmonary effort is normal. No respiratory distress.  ?   Breath sounds: Normal breath sounds.  ?Chest:  ?   Chest wall: No tenderness.  ?Abdominal:  ?   Palpations: Abdomen is soft.  ?   Tenderness: There is no abdominal tenderness. There is no guarding or rebound.  ?Musculoskeletal:     ?   General: No swelling.  ?   Cervical back: Neck supple.  ?Skin: ?   General: Skin is warm and dry.  ?   Capillary Refill: Capillary refill takes less than 2 seconds.  ?Neurological:  ?   General: No focal deficit present.  ?   Mental Status: She is alert and oriented to person, place, and time.  ?   Cranial Nerves: No cranial nerve deficit.  ?   Sensory: No sensory deficit.  ?   Motor: No weakness.  ?   Coordination: Coordination normal.  ?Psychiatric:     ?   Mood and Affect: Mood normal.  ? ? ?ED Results / Procedures / Treatments   ?Labs ?(all labs ordered are listed, but only abnormal results  are displayed) ?Labs Reviewed  ?BASIC METABOLIC PANEL - Abnormal; Notable for the following components:  ?    Result Value  ? Potassium 3.1 (*)   ? Glucose, Bld 105 (*)   ? Creatinine, Ser 1.29 (*)   ? Calcium 8.7 (*)   ? GFR, Estimated 56 (*)   ? All other components within normal limits  ?CBC WITH DIFFERENTIAL/PLATELET - Abnormal; Notable for the following components:  ? RBC 5.12 (*)   ? All other components within normal limits  ?URINALYSIS, ROUTINE W REFLEX MICROSCOPIC - Abnormal; Notable for the following components:  ? Glucose, UA 50 (*)   ? Protein, ur 30 (*)   ? All other components within normal limits  ?PREGNANCY, URINE  ?TROPONIN I (HIGH SENSITIVITY)  ?TROPONIN I (HIGH SENSITIVITY)  ? ? ?EKG ?EKG Interpretation ? ?Date/Time:  Monday  April 11 2022 21:55:05 EDT ?Ventricular Rate:  64 ?PR Interval:  157 ?QRS Duration: 97 ?QT Interval:  413 ?QTC Calculation: 427 ?R Axis:   -31 ?Text Interpretation: Sinus rhythm Left ventricular hypertrophy Nonspecific T abnormalities, lateral leads No significant change since last tracing Confirmed by Richardean Canal [phone]) on 04/11/2022 10:09:21 PM ? ?Radiology ?No results found. ? ?Procedures ?Procedures  ? ?Medications Ordered in ED ?Medications  ?potassium chloride SA (KLOR-CON M) CR tablet 40 mEq (40 mEq Oral Given 04/11/22 2213)  ?amLODipine (NORVASC) tablet 5 mg (5 mg Oral Given 04/11/22 2212)  ? ? ?ED Course/ Medical Decision Making/ A&P ?  ?                        ?Medical Decision Making ?Problems Addressed: ?Secondary hypertension: acute illness or injury that poses a threat to life or bodily functions ? ?Amount and/or Complexity of Data Reviewed ?Labs: ordered. Decision-making details documented in ED Course. ?ECG/medicine tests: ordered and independent interpretation performed. Decision-making details documented in ED Course. ? ?Risk ?Prescription drug management. ? ? ?Patient is a 36 year old female with a history of hypertension not on any blood pressure medication presents to the emergency department for elevated blood pressure.  Physical exam without focal neurological deficit.  Patient vital signs remarkable for elevated blood pressure in the 220s systolic.  ? ?At this time, less concern for hypertensive emergency or urgency.  Patient elevated blood pressure is unclear at this time.  Her CBC is without leukocytosis.  Her hemoglobin hematocrit are stable.  Her BMP without severe metabolic derangement except for mild hypokalemia.  Her creatinine is 1.29.  Her previous creatinine was 1.5.  Unclear if this is renally mediated.  Urine pregnancy is negative.  Delta troponin is negative.  Her EKG is without acute ischemic etiology. ? ?Intervention: I have replenish her potassium and will give her Norvasc 5 mg  for high blood pressure. ? ?Patient informed me she just got her insurance card.  She reports she is about to establish a primary care provider.  I will send her out with 30 days of Norvasc 5 mg.  I informed her to follow-up with PCP as soon as possible to get further work-up for elevated blood pressure.  I believe patient is stable for discharge and will not require acute intervention at this time.  Strict return precaution has been discussed.  Patient understand and agrees with plan. ? ?Final Clinical Impression(s) / ED Diagnoses ?Final diagnoses:  ?Secondary hypertension  ? ? ?Rx / DC Orders ?ED Discharge Orders   ? ?      Ordered  ?  amLODipine (NORVASC) 5 MG tablet  Daily       ? 04/11/22 2229  ? ?  ?  ? ?  ? ? ?  ?Jari Sportsman, MD ?04/11/22 2309 ? ?  ?Charlynne Pander, MD ?04/11/22 2314 ? ?

## 2022-04-11 NOTE — ED Triage Notes (Signed)
Pt coming from dr, pt had dental work done this am, noted to be hypertensive there. Hypertensive in triage. ?

## 2022-04-11 NOTE — ED Notes (Signed)
X1 no response vitals recheck 

## 2022-04-11 NOTE — ED Provider Triage Note (Signed)
Emergency Medicine Provider Triage Evaluation Note ? ?Polly Cobia , a 36 y.o. female  was evaluated in triage.  Pt complains of hypertension.  Reports that she went for oral surgery today and both before and after surgery blood pressure was noted to be high.  Patient reports that she has had elevated blood pressures in the past however has not had a PCP and was never prescribed any medications regularly. ? ?Patient denies any chest pain, shortness of breath, numbness, weakness, visual disturbance, headache, facial asymmetry, lightheadedness, syncope. ? ?Review of Systems  ?Positive: Hypertension ?Negative: See above ? ?Physical Exam  ?BP (!) 229/152 (BP Location: Left Arm)   Pulse 73   Temp 97.6 ?F (36.4 ?C)   Resp 16   SpO2 100%  ?Gen:   Awake, no distress   ?Resp:  Normal effort, clear to auscultation bilaterally ?MSK:   Moves extremities without difficulty  ?Other:  +2 radial pulse bilaterally.  Patient has gauze in her mouth which is noted to be bloody secondary to recent oral surgery. ? ?Medical Decision Making  ?Medically screening exam initiated at 2:00 PM.  Appropriate orders placed.  Savy Seratt was informed that the remainder of the evaluation will be completed by another provider, this initial triage assessment does not replace that evaluation, and the importance of remaining in the ED until their evaluation is complete. ? ? ?  ?Loni Beckwith, PA-C ?04/11/22 1402 ? ?

## 2022-06-16 ENCOUNTER — Encounter: Payer: Self-pay | Admitting: Cardiology

## 2022-07-11 IMAGING — CT CT HEAD W/O CM
3 series · 16 of 47 positions shown, 19 images · non-contrast
Comparison: None.

CLINICAL DATA: Syncope, simple, normal neuro exam.

EXAM:
CT HEAD WITHOUT CONTRAST
TECHNIQUE: Contiguous axial images were obtained from the base of the skull
through the vertex without intravenous contrast.

[Series 2: head 5.0 (person_name)30(person_name) (person_name · axial · 0.41mm/px · z∈[-122,+3]mm · 10 of 31 slices shown, 13 images]
[im 3/31  brain]
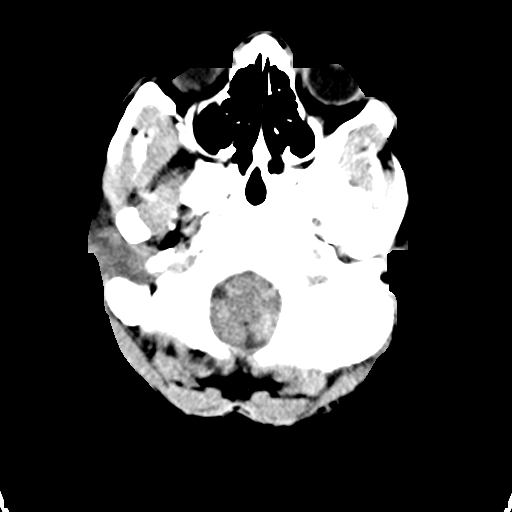
[im 3/31  bone]
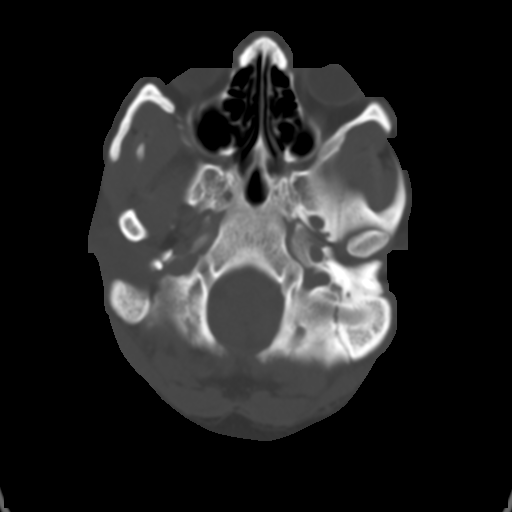
[im 6/31  brain]
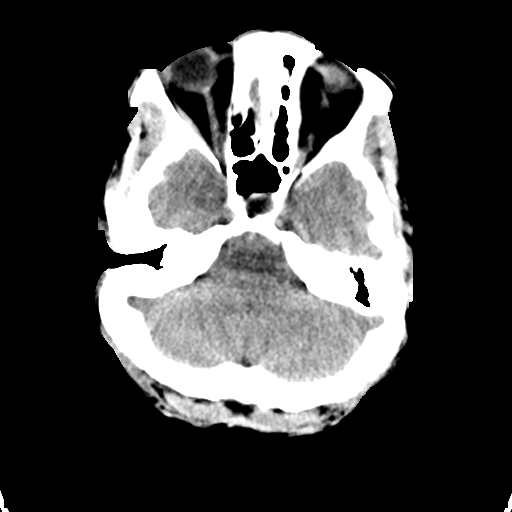
[im 9/31  brain]
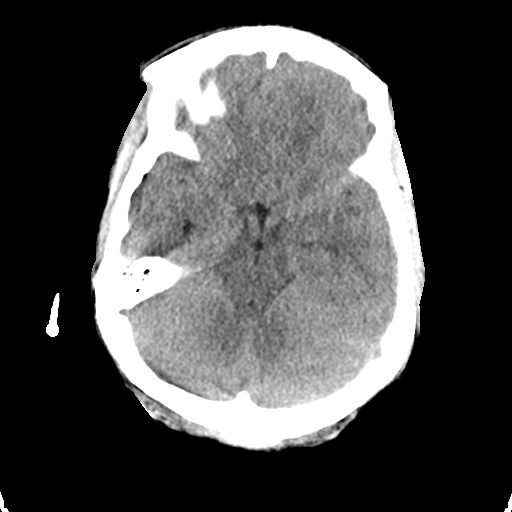
[im 11/31  brain]
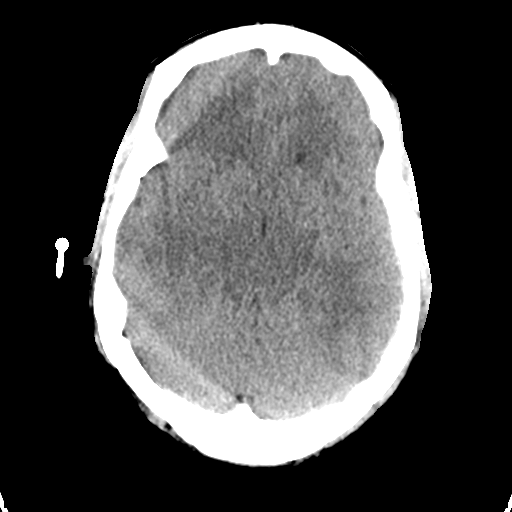
[im 14/31  brain]
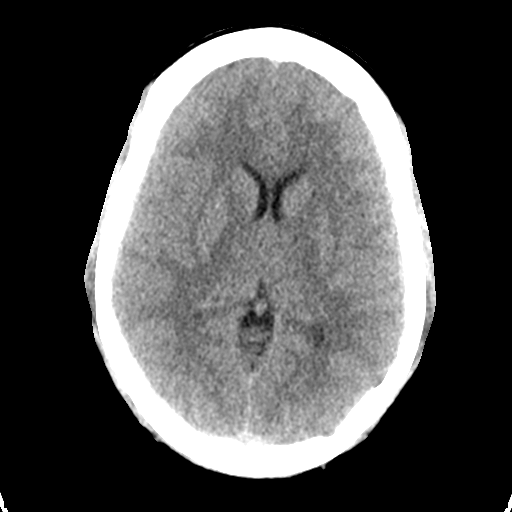
[im 14/31  bone]
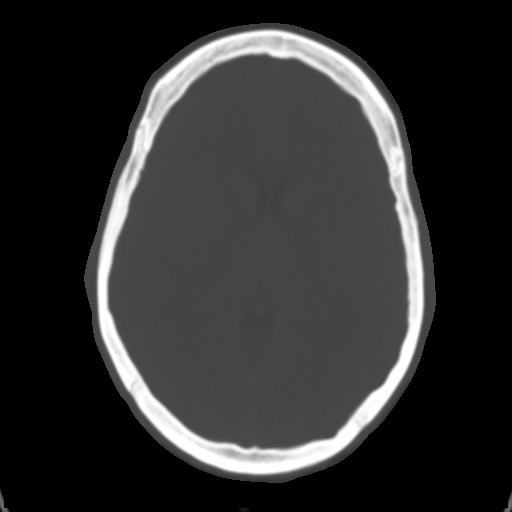
[im 17/31  brain]
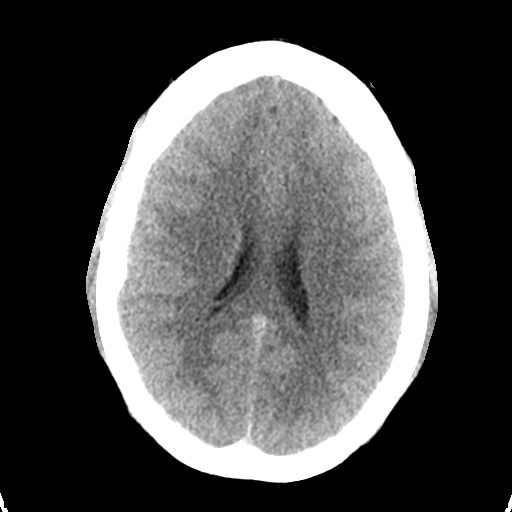
[im 20/31  brain]
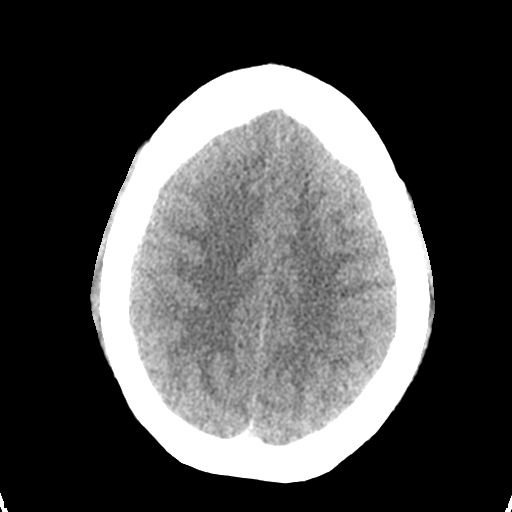
[im 23/31  brain]
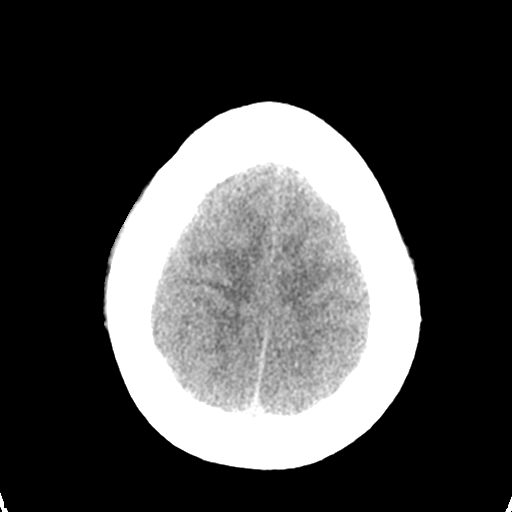
[im 25/31  brain]
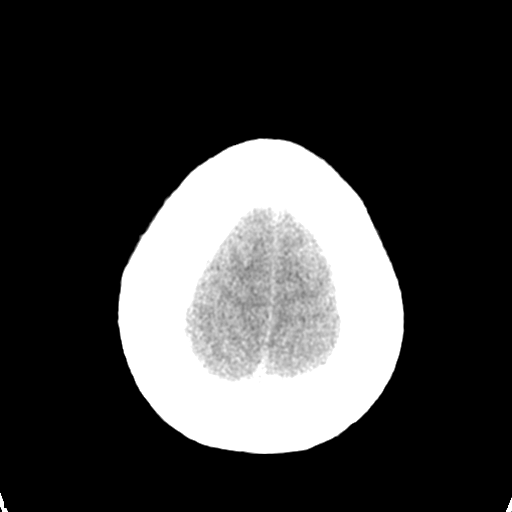
[im 25/31  bone]
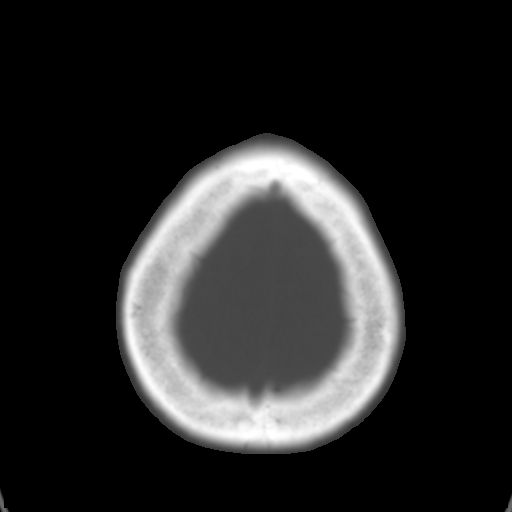
[im 28/31  brain]
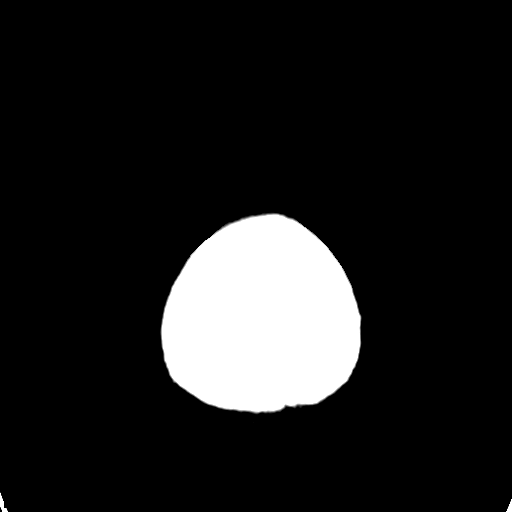

[Series 4: head 3.0 mpr cor · coronal · 0.32mm/px · 3 of 67 slices shown]
[im 23/67  brain]
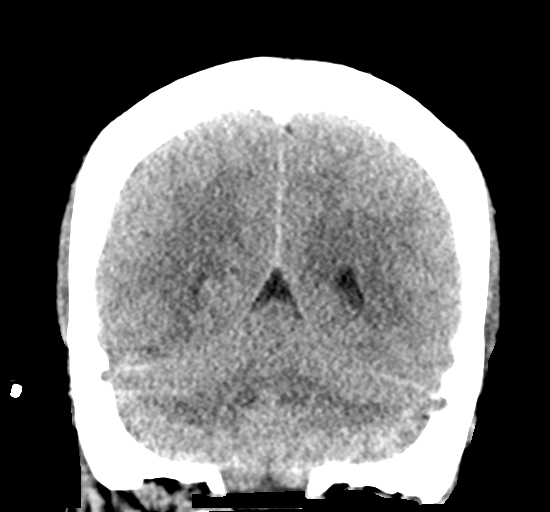
[im 30/67  brain]
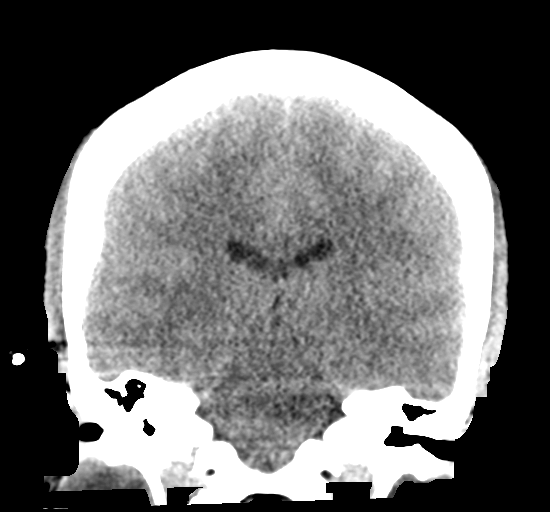
[im 37/67  brain]
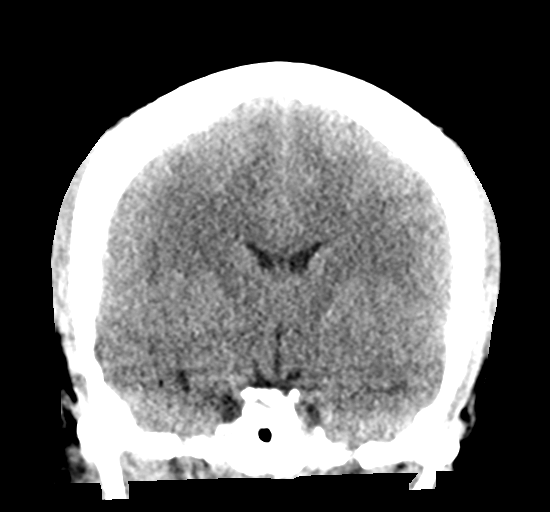

[Series 5: head 3.0 mpr sag · sagittal · 0.32mm/px · 3 of 59 slices shown]
[im 20/59  brain]
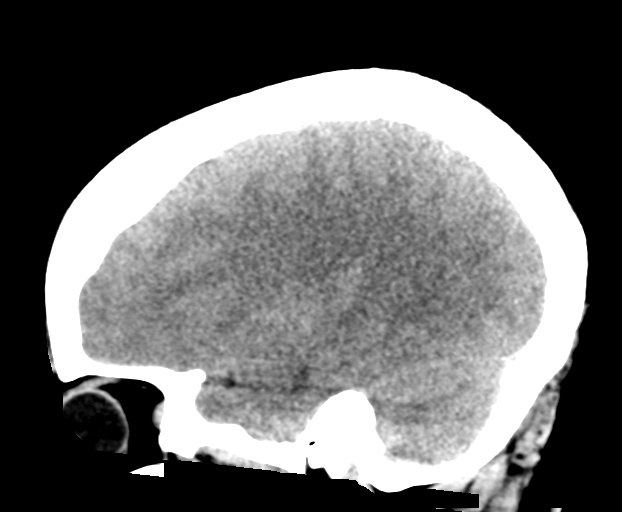
[im 30/59  brain]
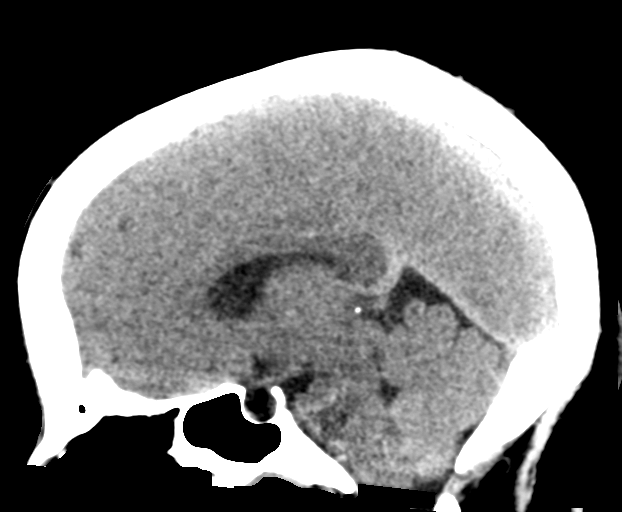
[im 39/59  brain]
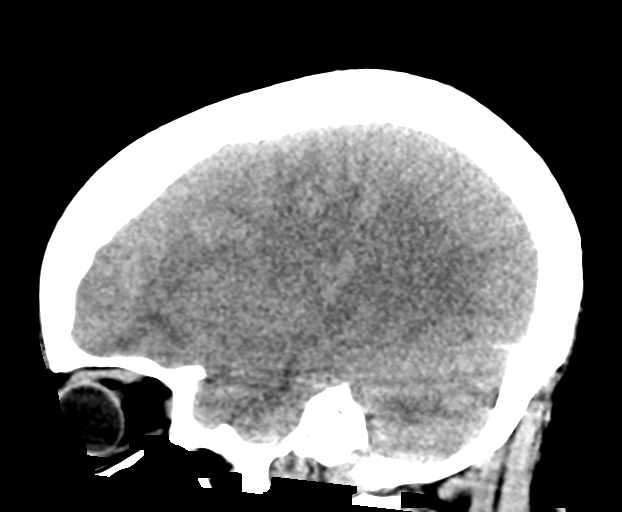

[16 of 47 positions shown; findings below may reference images not displayed]

FINDINGS: Brain: Normal anatomic configuration. No abnormal intra or
extra-axial mass lesion or fluid collection. No abnormal mass effect
or midline shift. No evidence of acute intracranial hemorrhage or
infarct. Ventricular size is normal. Cerebellum unremarkable.

Vascular: Unremarkable

Skull: Intact

Sinuses/Orbits: Paranasal sinuses are clear. Orbits are
unremarkable.

Other: Mastoid air cells and middle ear cavities are clear.
IMPRESSION: No acute intracranial abnormality.

## 2022-07-19 NOTE — Progress Notes (Unsigned)
Cardiology Office Note:    Date:  07/20/2022   ID:  Stephanie Evans, DOB 16-Sep-1986, MRN [id number]  PCP:  Pcp, No  Cardiologist:  Norman Herrlich, MD   Referring MD: Julianne Handler, NP  ASSESSMENT:    1. Primary hypertension    PLAN:    In order of problems listed above:  She is a young woman has had longstanding hypertension quite severe with evidence of endorgan damage mild renal dysfunction abnormal EKG and I suspect she has LVH.  Likely she has essential hypertension however she was hypokalemic we will check a urine range and aldosterone level I think spironolactone is a good choice we will continue it but I think she needs some immediate acting agents I will place her on low-dose Catapres at bedtime as well as carvedilol and consider a trial with an ARB in the future but I would never give her ACE inhibitor again as it can cause anaphylaxis in the black population.  She really is very insightful careful she will continue sodium restriction exercise she is attended to her weight problem and I told her as a goal would like to see her systolic in the range of 160/90.  She will be seeing her PCP next month and what we will do is we will alternate visits and adjust medications to get her at target unfortunately I do not have the ability to see her frequently in my office as a cardiovascular consultant.  I will see her back in 2 months lasting 2 weeks to send me a MyChart message about her home blood pressures I educated her about technique for checking blood pressure gave her educational material including the DASH diet.  Next appointment 2 months with me 1 month with her PCP   Medication Adjustments/Labs and Tests Ordered: Current medicines are reviewed at length with the patient today.  Concerns regarding medicines are outlined above.  Orders Placed This Encounter  Procedures   Basic Metabolic Panel (BMET)   Aldosterone + renin activity w/ ratio   EKG 12-Lead   VAS US RENAL ARTERY DUPLEX    Meds ordered this encounter  Medications   carvedilol (COREG) 6.25 MG tablet    Sig: Take 1 tablet (6.25 mg total) by mouth 2 (two) times daily.    Dispense:  180 tablet    Refill:  3   cloNIDine (CATAPRES) 0.1 MG tablet    Sig: Take 1 tablet (0.1 mg total) by mouth at bedtime.    Dispense:  60 tablet    Refill:  11      Complaint my PCP sent me here to assist in achieving blood pressure control  History of Present Illness:    Stephanie Evans is a 36 y.o. female who is being seen today for the evaluation of hypertension at the request of Julianne Handler, NP.  History of longstanding hypertension poorly controlled she was at [facility] in April with hypertensive urgency was recently seen by her PCP 05/31/2022 with severe hypertension and unfortunately was not taking her medications.  She was started on amlodipine.  Recent labs 05/31/2022 CMP with a sodium 138 potassium diminished 3.3 creatinine 1.2 GFR greater than 60 cc/min He was seen in [facility] and admitted 09/23/2021 to the notes say she was admitted because of hypertension.  Discharge diagnosis is chest pain troponins were assessed all undetectable proBNP level was low chest x-ray was normal EKG independently reviewed by me showed sinus rhythm nonspecific T waves.  She was not  seen by cardiology and had no cardiac diagnostic test performed.  She is a very insightful young woman very interested in controlling her hypertension First noted when she was 36 years old she said her blood pressure was severely elevated but was not put on medical treatment Patient is taking medications now for about 4 years at one time she took an ACE inhibitor and it sounds like she had anaphylaxis. Recently was started on treatment with amlodipine and spironolactone she had mild hypokalemia She does regular physical activity to control blood pressure restrict salt she has lost 52 pounds and despite that she continues to have severe  hypertension and shows me numbers in the range of 203-220/130 at home No headache visual changes chest pain shortness of breath or changes in her urine appearance She has no known history of kidney disease. Hypertension runs in her family she has a sister her age and medications and her father. She has 3 children surprisingly tells me she did not have hypertensive disorder pregnancy Past Medical History:  Diagnosis Date   Hypertension     Past Surgical History:  Procedure Laterality Date   NO PAST SURGERIES      Current Medications: Current Meds  Medication Sig   carvedilol (COREG) 6.25 MG tablet Take 1 tablet (6.25 mg total) by mouth 2 (two) times daily.   cloNIDine (CATAPRES) 0.1 MG tablet Take 1 tablet (0.1 mg total) by mouth at bedtime.     Allergies:   Lisinopril-hydrochlorothiazide   Social History   Socioeconomic History   Marital status: Significant Other    Spouse name: Not on file   Number of children: Not on file   Years of education: Not on file   Highest education level: Not on file  Occupational History   Not on file  Tobacco Use   Smoking status: Never   Smokeless tobacco: Never  Vaping Use   Vaping Use: Never used  Substance and Sexual Activity   Alcohol use: Yes    Comment: Rare   Drug use: Yes    Types: Marijuana    Comment: Daily   Sexual activity: Not on file  Other Topics Concern   Not on file  Social History Narrative   Not on file   Social Determinants of Health   Financial Resource Strain: Not on file  Food Insecurity: Not on file  Transportation Needs: Not on file  Physical Activity: Not on file  Stress: Not on file  Social Connections: Not on file     Family History: The patient's family history includes Breast cancer in her mother; Diabetes in her father and mother; Hypertension in her father and mother.  ROS:   ROS Please see the history of present illness.     All other systems reviewed and are negative.  EKGs/Labs/Other  Studies Reviewed:    The following studies were reviewed today:   EKG:  EKG is  ordered today.  The ekg ordered today is personally reviewed and demonstrates sinus rhythm left atrial enlargement LVH repolarization changes  Recent Labs: 09/26/2021: ALT 12; Magnesium 2.4 04/11/2022: BUN 14; Creatinine, Ser 1.29; Hemoglobin 14.9; Platelets 213; Potassium 3.1; Sodium 138    Physical Exam:    VS:  BP (!) 180/100 (BP Location: Left Arm, Patient Position: Sitting, Cuff Size: Normal)   Pulse 71   Ht 5' 3.5" (1.613 m)   Wt 152 lb (68.9 kg)   SpO2 98%   BMI 26.50 kg/m     Wt Readings from  Last 3 Encounters:  07/20/22 152 lb (68.9 kg)  05/31/22 156 lb (70.8 kg)  09/26/21 159 lb (72.1 kg)    BP 210/110 RUE sitting   GEN:  Well nourished, well developed in no acute distress HEENT: Normal NECK: No JVD; No carotid bruits LYMPHATICS: No lymphadenopathy CARDIAC: RRR, no murmurs, rubs, gallops RESPIRATORY:  Clear to auscultation without rales, wheezing or rhonchi  ABDOMEN: Soft, non-tender, non-distended MUSCULOSKELETAL:  No edema; No deformity  SKIN: Warm and dry NEUROLOGIC:  Alert and oriented x 3 PSYCHIATRIC:  Normal affect     Signed, Norman Herrlich, MD  07/20/2022 10:39 AM    [facility]

## 2022-07-20 ENCOUNTER — Encounter: Payer: Self-pay | Admitting: Cardiology

## 2022-07-20 ENCOUNTER — Ambulatory Visit (INDEPENDENT_AMBULATORY_CARE_PROVIDER_SITE_OTHER): Payer: Managed Care, Other (non HMO) | Admitting: Cardiology

## 2022-07-20 VITALS — BP 180/100 | HR 71 | Ht 63.5 in | Wt 152.0 lb

## 2022-07-20 DIAGNOSIS — I10 Essential (primary) hypertension: Secondary | ICD-10-CM | POA: Diagnosis not present

## 2022-07-20 MED ORDER — CARVEDILOL 6.25 MG PO TABS: 6.2500 mg | ORAL_TABLET | Freq: Two times a day (BID) | ORAL | 3 refills | Status: DC

## 2022-07-20 MED ORDER — CLONIDINE HCL 0.1 MG PO TABS: 0.1000 mg | ORAL_TABLET | Freq: Every day | ORAL | 11 refills | Status: DC

## 2022-07-20 NOTE — Patient Instructions (Addendum)
Medication Instructions:  Your physician has recommended you make the following change in your medication:   START: Carvedilol 6.25 mg twice daily START: Catapress 0.1 mg daily at hour of sleep  *If you need a refill on your cardiac medications before your next appointment, please call your pharmacy*   Lab Work: Your physician recommends that you return for lab work in:   Labs today: BMP, Renin/Aldosterone level  If you have labs (blood work) drawn today and your tests are completely normal, you will receive your results only by: MyChart Message (if you have MyChart) OR A paper copy in the mail If you have any lab test that is abnormal or we need to change your treatment, we will call you to review the results.   Testing/Procedures: Your physician has requested that you have a renal artery duplex. During this test, an ultrasound is used to evaluate blood flow to the kidneys. Allow one hour for this exam. Do not eat after midnight the day before and avoid carbonated beverages. Take your medications as you usually do.    Follow-Up: At [facility], you and your health needs are our priority.  As part of our continuing mission to provide you with exceptional heart care, we have created designated Provider Care Teams.  These Care Teams include your primary Cardiologist (physician) and Advanced Practice Providers (APPs -  Physician Assistants and Nurse Practitioners) who all work together to provide you with the care you need, when you need it.  We recommend signing up for the patient portal called "MyChart".  Sign up information is provided on this After Visit Summary.  MyChart is used to connect with patients for Virtual Visits (Telemedicine).  Patients are able to view lab/test results, encounter notes, upcoming appointments, etc.  Non-urgent messages can be sent to your provider as well.   To learn more about what you can do with MyChart, go to [email or web address].    Your next  appointment:   2 month(s)  The format for your next appointment:   In Person  Provider:   Norman Herrlich, MD    Other Instructions Send a 1 week list of blood pressures via MyChart  Important Information About Sugar         [facility]  Tips to measure your blood pressure correctly  To determine whether you have hypertension, a medical professional will take a blood pressure reading. How you prepare for the test, the position of your arm, and other factors can change a blood pressure reading by 10% or more. That could be enough to hide high blood pressure, start you on a drug you don't really need, or lead your doctor to incorrectly adjust your medications. National and international guidelines offer specific instructions for measuring blood pressure. If a doctor, nurse, or medical assistant isn't doing it right, don't hesitate to ask him or her to get with the guidelines. Here's what you can do to ensure a correct reading:  Don't drink a caffeinated beverage or smoke during the 30 minutes before the test.  Sit quietly for five minutes before the test begins.  During the measurement, sit in a chair with your feet on the floor and your arm supported so your elbow is at about heart level.  The inflatable part of the cuff should completely cover at least 80% of your upper arm, and the cuff should be placed on bare skin, not over a shirt.  Don't talk during the measurement.  Have your blood pressure measured twice,  with a brief break in between. If the readings are different by 5 points or more, have it done a third time. There are times to break these rules. If you sometimes feel lightheaded when getting out of bed in the morning or when you stand after sitting, you should have your blood pressure checked while seated and then while standing to see if it falls from one position to the next. Because blood pressure varies throughout the day, your doctor will rarely diagnose hypertension on  the basis of a single reading. Instead, he or she will want to confirm the measurements on at least two occasions, usually within a few weeks of one another. The exception to this rule is if you have a blood pressure reading of 180/110 mm Hg or higher. A result this high usually calls for prompt treatment. It's also a good idea to have your blood pressure measured in both arms at least once, since the reading in one arm (usually the right) may be higher than that in the left. A 2014 study in The American Journal of Medicine of nearly 3,400 people found average arm- to-arm differences in systolic blood pressure of about 5 points. The higher number should be used to make treatment decisions. In 2017, new guidelines from the [place], the [employer] of [employer], and nine other health organizations lowered the diagnosis of high blood pressure to 130/80 mm Hg or higher for all adults. The guidelines also redefined the various blood pressure categories to now include normal, elevated, Stage 1 hypertension, Stage 2 hypertension, and hypertensive crisis (see "Blood pressure categories"). Blood pressure categories  Blood pressure category SYSTOLIC (upper number)  DIASTOLIC (lower number)  Normal Less than 120 mm Hg and Less than 80 mm Hg  Elevated 120-129 mm Hg and Less than 80 mm Hg  High blood pressure: Stage 1 hypertension 130-139 mm Hg or 80-89 mm Hg  High blood pressure: Stage 2 hypertension 140 mm Hg or higher or 90 mm Hg or higher  Hypertensive crisis (consult your doctor immediately) Higher than 180 mm Hg and/or Higher than 120 mm Hg  Source: American Heart Association and American Stroke Association. For more on getting your blood pressure under control, buy Controlling Your Blood Pressure, a Special Health Report from [facility].  DASH diet: Healthy eating to lower your blood pressure The DASH diet emphasizes portion size, eating a variety of foods and getting  the right amount of nutrients. Discover how DASH can improve your health and lower your blood pressure. By [facility] Staff  DASH stands for Dietary Approaches to Stop Hypertension. The DASH diet is a lifelong approach to healthy eating that's designed to help treat or prevent high blood pressure (hypertension). The DASH diet encourages you to reduce the sodium in your diet and eat a variety of foods rich in nutrients that help lower blood pressure, such as potassium, calcium and magnesium. By following the DASH diet, you may be able to reduce your blood pressure by a few points in just two weeks. Over time, your systolic blood pressure could drop by eight to 14 points, which can make a significant difference in your health risks. Because the DASH diet is a healthy way of eating, it offers health benefits besides just lowering blood pressure. The DASH diet is also in line with dietary recommendations to prevent osteoporosis, cancer, heart disease, stroke and diabetes. DASH diet: Sodium levels The DASH diet emphasizes vegetables, fruits and low-fat dairy foods --  and moderate amounts of whole grains, fish, poultry and nuts. In addition to the standard DASH diet, there is also a lower sodium version of the diet. You can choose the version of the diet that meets your health needs: Standard DASH diet. You can consume up to 2,300 milligrams (mg) of sodium a day.  Lower sodium DASH diet. You can consume up to 1,500 mg of sodium a day. Both versions of the DASH diet aim to reduce the amount of sodium in your diet compared with what you might get in a typical American diet, which can amount to a whopping 3,400 mg of sodium a day or more. The standard DASH diet meets the recommendation from the Dietary Guidelines for Americans to keep daily sodium intake to less than 2,300 mg a day. The American Heart Association recommends 1,500 mg a day of sodium as an upper limit for all adults. If you aren't sure what sodium  level is right for you, talk to your doctor. DASH diet: What to eat Both versions of the DASH diet include lots of whole grains, fruits, vegetables and low-fat dairy products. The DASH diet also includes some fish, poultry and legumes, and encourages a small amount of nuts and seeds a few times a week.  You can eat red meat, sweets and fats in small amounts. The DASH diet is low in saturated fat, cholesterol and total fat. Here's a look at the recommended servings from each food group for the 2,000-calorie-a-day DASH diet. Grains: 6 to 8 servings a day Grains include bread, cereal, rice and pasta. Examples of one serving of grains include 1 slice whole-wheat bread, 1 ounce dry cereal, or 1/2 cup cooked cereal, rice or pasta. Focus on whole grains because they have more fiber and nutrients than do refined grains. For instance, use brown rice instead of white rice, whole-wheat pasta instead of regular pasta and whole-grain bread instead of white bread. Look for products labeled "100 percent whole grain" or "100 percent whole wheat."  Grains are naturally low in fat. Keep them this way by avoiding butter, cream and cheese sauces. Vegetables: 4 to 5 servings a day Tomatoes, carrots, broccoli, sweet potatoes, greens and other vegetables are full of fiber, vitamins, and such minerals as potassium and magnesium. Examples of one serving include 1 cup raw leafy green vegetables or 1/2 cup cut-up raw or cooked vegetables. Don't think of vegetables only as side dishes -- a hearty blend of vegetables served over brown rice or whole-wheat noodles can serve as the main dish for a meal.  Fresh and frozen vegetables are both good choices. When buying frozen and canned vegetables, choose those labeled as low sodium or without added salt.  To increase the number of servings you fit in daily, be creative. In a stir-fry, for instance, cut the amount of meat in half and double up on the vegetables. Fruits: 4 to 5 servings  a day Many fruits need little preparation to become a healthy part of a meal or snack. Like vegetables, they're packed with fiber, potassium and magnesium and are typically low in fat -- coconuts are an exception. Examples of one serving include one medium fruit, 1/2 cup fresh, frozen or canned fruit, or 4 ounces of juice. Have a piece of fruit with meals and one as a snack, then round out your day with a dessert of fresh fruits topped with a dollop of low-fat yogurt.  Leave on edible peels whenever possible. The peels of apples, pears  and most fruits with pits add interesting texture to recipes and contain healthy nutrients and fiber.  Remember that citrus fruits and juices, such as grapefruit, can interact with certain medications, so check with your doctor or pharmacist to see if they're OK for you.  If you choose canned fruit or juice, make sure no sugar is added. Dairy: 2 to 3 servings a day Milk, yogurt, cheese and other dairy products are major sources of calcium, vitamin D and protein. But the key is to make sure that you choose dairy products that are low fat or fat-free because otherwise they can be a major source of fat -- and most of it is saturated. Examples of one serving include 1 cup skim or 1 percent milk, 1 cup low fat yogurt, or 1 1/2 ounces part-skim cheese. Low-fat or fat-free frozen yogurt can help you boost the amount of dairy products you eat while offering a sweet treat. Add fruit for a healthy twist.  If you have trouble digesting dairy products, choose lactose-free products or consider taking an over-the-counter product that contains the enzyme lactase, which can reduce or prevent the symptoms of lactose intolerance.  Go easy on regular and even fat-free cheeses because they are typically high in sodium. Lean meat, poultry and fish: 6 servings or fewer a day Meat can be a rich source of protein, B vitamins, iron and zinc. Choose lean varieties and aim for no more than 6 ounces  a day. Cutting back on your meat portion will allow room for more vegetables. Trim away skin and fat from poultry and meat and then bake, broil, grill or roast instead of frying in fat.  Eat heart-healthy fish, such as salmon, herring and tuna. These types of fish are high in omega-3 fatty acids, which can help lower your total cholesterol. Nuts, seeds and legumes: 4 to 5 servings a week Almonds, sunflower seeds, kidney beans, peas, lentils and other foods in this family are good sources of magnesium, potassium and protein. They're also full of fiber and phytochemicals, which are plant compounds that may protect against some cancers and cardiovascular disease. Serving sizes are small and are intended to be consumed only a few times a week because these foods are high in calories. Examples of one serving include 1/3 cup nuts, 2 tablespoons seeds, or 1/2 cup cooked beans or peas.  Nuts sometimes get a bad rap because of their fat content, but they contain healthy types of fat -- monounsaturated fat and omega-3 fatty acids. They're high in calories, however, so eat them in moderation. Try adding them to stir-fries, salads or cereals.  Soybean-based products, such as tofu and tempeh, can be a good alternative to meat because they contain all of the amino acids your body needs to make a complete protein, just like meat. Fats and oils: 2 to 3 servings a day Fat helps your body absorb essential vitamins and helps your body's immune system. But too much fat increases your risk of heart disease, diabetes and obesity. The DASH diet strives for a healthy balance by limiting total fat to less than 30 percent of daily calories from fat, with a focus on the healthier monounsaturated fats. Examples of one serving include 1 teaspoon soft margarine, 1 tablespoon mayonnaise or 2 tablespoons salad dressing. Saturated fat and trans fat are the main dietary culprits in increasing your risk of coronary artery disease. DASH  helps keep your daily saturated fat to less than 6 percent of your total calories  by limiting use of meat, butter, cheese, whole milk, cream and eggs in your diet, along with foods made from lard, solid shortenings, and palm and coconut oils.  Avoid trans fat, commonly found in such processed foods as crackers, baked goods and fried items.  Read food labels on margarine and salad dressing so that you can choose those that are lowest in saturated fat and free of trans fat. Sweets: 5 servings or fewer a week You don't have to banish sweets entirely while following the DASH diet -- just go easy on them. Examples of one serving include 1 tablespoon sugar, jelly or jam, 1/2 cup sorbet, or 1 cup lemonade. When you eat sweets, choose those that are fat-free or low-fat, such as sorbets, fruit ices, jelly beans, hard candy, graham crackers or low-fat cookies.  Artificial sweeteners such as aspartame (NutraSweet, Equal) and sucralose (Splenda) may help satisfy your sweet tooth while sparing the sugar. But remember that you still must use them sensibly. It's OK to swap a diet cola for a regular cola, but not in place of a more nutritious beverage such as low-fat milk or even plain water.  Cut back on added sugar, which has no nutritional value but can pack on calories. DASH diet: Alcohol and caffeine Drinking too much alcohol can increase blood pressure. The Dietary Guidelines for Americans recommends that men limit alcohol to no more than two drinks a day and women to one or less. The DASH diet doesn't address caffeine consumption. The influence of caffeine on blood pressure remains unclear. But caffeine can cause your blood pressure to rise at least temporarily. If you already have high blood pressure or if you think caffeine is affecting your blood pressure, talk to your doctor about your caffeine consumption. DASH diet and weight loss While the DASH diet is not a weight-loss program, you may indeed lose  unwanted pounds because it can help guide you toward healthier food choices. The DASH diet generally includes about 2,000 calories a day. If you're trying to lose weight, you may need to eat fewer calories. You may also need to adjust your serving goals based on your individual circumstances -- something your health care team can help you decide. Tips to cut back on sodium The foods at the core of the DASH diet are naturally low in sodium. So just by following the DASH diet, you're likely to reduce your sodium intake. You also reduce sodium further by: Using sodium-free spices or flavorings with your food instead of salt  Not adding salt when cooking rice, pasta or hot cereal  Rinsing canned foods to remove some of the sodium  Buying foods labeled "no salt added," "sodium-free," "low sodium" or "very low sodium" One teaspoon of table salt has 2,325 mg of sodium. When you read food labels, you may be surprised at just how much sodium some processed foods contain. Even low-fat soups, canned vegetables, ready-to-eat cereals and sliced [place] from the local deli -- foods you may have considered healthy -- often have lots of sodium. You may notice a difference in taste when you choose low-sodium food and beverages. If things seem too bland, gradually introduce low-sodium foods and cut back on table salt until you reach your sodium goal. That'll give your palate time to adjust. Using salt-free seasoning blends or herbs and spices may also ease the transition. It can take several weeks for your taste buds to get used to less salty foods. Putting the pieces of the DASH diet  together Try these strategies to get started on the DASH diet:  Change gradually. If you now eat only one or two servings of fruits or vegetables a day, try to add a serving at lunch and one at dinner. Rather than switching to all whole grains, start by making one or two of your grain servings whole grains. Increasing fruits, vegetables and  whole grains gradually can also help prevent bloating or diarrhea that may occur if you aren't used to eating a diet with lots of fiber. You can also try over-the-counter products to help reduce gas from beans and vegetables.  Reward successes and forgive slip-ups. Reward yourself with a nonfood treat for your accomplishments -- rent a movie, purchase a book or get together with a friend. Everyone slips, especially when learning something new. Remember that changing your lifestyle is a long-term process. Find out what triggered your setback and then just pick up where you left off with the DASH diet.  Add physical activity. To boost your blood pressure lowering efforts even more, consider increasing your physical activity in addition to following the DASH diet. Combining both the DASH diet and physical activity makes it more likely that you'll reduce your blood pressure.  Get support if you need it. If you're having trouble sticking to your diet, talk to your doctor or dietitian about it. You might get some tips that will help you stick to the DASH diet. Remember, healthy eating isn't an all-or-nothing proposition. What's most important is that, on average, you eat healthier foods with plenty of variety -- both to keep your diet nutritious and to avoid boredom or extremes. And with the DASH diet, you can have both.

## 2022-07-21 ENCOUNTER — Ambulatory Visit (INDEPENDENT_AMBULATORY_CARE_PROVIDER_SITE_OTHER): Payer: Managed Care, Other (non HMO)

## 2022-07-21 DIAGNOSIS — I10 Essential (primary) hypertension: Secondary | ICD-10-CM | POA: Diagnosis not present

## 2022-07-26 ENCOUNTER — Other Ambulatory Visit: Payer: Self-pay

## 2022-07-26 DIAGNOSIS — I10 Essential (primary) hypertension: Secondary | ICD-10-CM

## 2022-07-26 LAB — BASIC METABOLIC PANEL
BUN/Creatinine Ratio: 13 (ref 9–23)
BUN: 19 mg/dL (ref 6–20)
CO2: 26 mmol/L (ref 20–29)
Calcium: 9.5 mg/dL (ref 8.7–10.2)
Chloride: 99 mmol/L (ref 96–106)
Creatinine, Ser: 1.52 mg/dL — ABNORMAL HIGH (ref 0.57–1.00)
Glucose: 86 mg/dL (ref 70–99)
Potassium: 3.5 mmol/L (ref 3.5–5.2)
Sodium: 140 mmol/L (ref 134–144)
eGFR: 46 mL/min/{1.73_m2} — ABNORMAL LOW (ref >59)

## 2022-07-26 LAB — ALDOSTERONE + RENIN ACTIVITY W/ RATIO
ALDOS/RENIN RATIO: 2.8 (ref 0.0–30.0)
ALDOSTERONE: 20.9 ng/dL (ref 0.0–30.0)
Renin: 7.362 ng/mL/hr — ABNORMAL HIGH (ref 0.167–5.380)

## 2022-07-27 ENCOUNTER — Telehealth: Payer: Self-pay | Admitting: Cardiology

## 2022-07-27 ENCOUNTER — Encounter: Payer: Self-pay | Admitting: Cardiology

## 2022-07-27 MED ORDER — CLONIDINE HCL 0.1 MG PO TABS: 0.1000 mg | ORAL_TABLET | Freq: Two times a day (BID) | ORAL | 6 refills | Status: DC

## 2022-07-27 NOTE — Telephone Encounter (Signed)
I called the patient to verify how she is feeling with her extremely elevated BP readings. Per the patient she is having no symptoms at this time.   I have advised her Dr. Dulce Sellar reviewed the readings and of his recommendations as stated below.   I confirmed with the patient that she is currently taking: 1) amlodipine 5 mg once daily 2) coreg 6.25 mg BID 3) clonidine 0.1 mg qhs 4) spironolactone 25 mg once daily   I have also advised her if in 1 hour after taking the clonidine 0.1 mg dose, her BP does come below 190/120, she should still take her regular nighttime dose of clonidine.   The patient voices understanding of the above and is agreeable.   She is aware I will send an updated RX to her pharmacy as well for the new dose on the clonidine.  She requested the RX go to [facility] in [place] on [address].

## 2022-07-27 NOTE — Telephone Encounter (Signed)
The patient forwarded the following BP readings to her Mychart today: July 23, 2022 195/116 July 24, 2022 187/129 July 25, 2022 203/131 July 26, 2022 228/150 August 9,2023 220/142   Secure Chat sent to Dr. Dulce Sellar to review and advise if the patient should report directly to the ER.   Per Dr. Dulce Sellar: Take clonidine 0.1 mg now, if blood pressure in 1 hour is not less then 190 and 120 I would go to the ED med [facility] [place]  Increase clonidine to 0.1 mg twice daily  Please be sure she is using upper extremity digital blood pressure cuff at rest and a good validated brand like Omron.

## 2022-07-28 ENCOUNTER — Other Ambulatory Visit: Payer: Self-pay

## 2022-09-15 DIAGNOSIS — I7100 Dissection of unspecified site of aorta: Secondary | ICD-10-CM

## 2022-09-15 DIAGNOSIS — I161 Hypertensive emergency: Secondary | ICD-10-CM

## 2022-09-15 DIAGNOSIS — R079 Chest pain, unspecified: Secondary | ICD-10-CM

## 2022-09-18 DIAGNOSIS — I1A0 Resistant hypertension: Secondary | ICD-10-CM | POA: Insufficient documentation

## 2022-09-18 DIAGNOSIS — I7100 Dissection of unspecified site of aorta: Secondary | ICD-10-CM | POA: Insufficient documentation

## 2022-09-18 DIAGNOSIS — I10 Essential (primary) hypertension: Secondary | ICD-10-CM | POA: Insufficient documentation

## 2022-09-18 HISTORY — DX: Resistant hypertension: I1A.0

## 2022-10-04 ENCOUNTER — Ambulatory Visit: Payer: Managed Care, Other (non HMO) | Attending: Cardiology | Admitting: Cardiology

## 2022-10-04 ENCOUNTER — Encounter: Payer: Self-pay | Admitting: Cardiology

## 2022-10-04 VITALS — BP 128/62 | HR 78 | Ht 63.5 in | Wt 133.6 lb

## 2022-10-04 DIAGNOSIS — I1A0 Resistant hypertension: Secondary | ICD-10-CM

## 2022-10-04 DIAGNOSIS — I71012 Dissection of descending thoracic aorta: Secondary | ICD-10-CM

## 2022-10-04 NOTE — Patient Instructions (Addendum)
[facility]  Tips to measure your blood pressure correctly  To determine whether you have hypertension, a medical professional will take a blood pressure reading. How you prepare for the test, the position of your arm, and other factors can change a blood pressure reading by 10% or more. That could be enough to hide high blood pressure, start you on a drug you don't really need, or lead your doctor to incorrectly adjust your medications. National and international guidelines offer specific instructions for measuring blood pressure. If a doctor, nurse, or medical assistant isn't doing it right, don't hesitate to ask him or her to get with the guidelines. Here's what you can do to ensure a correct reading:  Don't drink a caffeinated beverage or smoke during the 30 minutes before the test.  Sit quietly for five minutes before the test begins.  During the measurement, sit in a chair with your feet on the floor and your arm supported so your elbow is at about heart level.  The inflatable part of the cuff should completely cover at least 80% of your upper arm, and the cuff should be placed on bare skin, not over a shirt.  Don't talk during the measurement.  Have your blood pressure measured twice, with a brief break in between. If the readings are different by 5 points or more, have it done a third time. There are times to break these rules. If you sometimes feel lightheaded when getting out of bed in the morning or when you stand after sitting, you should have your blood pressure checked while seated and then while standing to see if it falls from one position to the next. Because blood pressure varies throughout the day, your doctor will rarely diagnose hypertension on the basis of a single reading. Instead, he or she will want to confirm the measurements on at least two occasions, usually within a few weeks of one another. The exception to this rule is if you have a blood pressure reading of 180/110 mm Hg  or higher. A result this high usually calls for prompt treatment. It's also a good idea to have your blood pressure measured in both arms at least once, since the reading in one arm (usually the right) may be higher than that in the left. A 2014 study in The American Journal of Medicine of nearly 3,400 people found average arm- to-arm differences in systolic blood pressure of about 5 points. The higher number should be used to make treatment decisions. In 2017, new guidelines from the [place], the [employer] of Cardiology, and nine other health organizations lowered the diagnosis of high blood pressure to 130/80 mm Hg or higher for all adults. The guidelines also redefined the various blood pressure categories to now include normal, elevated, Stage 1 hypertension, Stage 2 hypertension, and hypertensive crisis (see "Blood pressure categories"). Blood pressure categories  Blood pressure category SYSTOLIC (upper number)  DIASTOLIC (lower number)  Normal Less than 120 mm Hg and Less than 80 mm Hg  Elevated 120-129 mm Hg and Less than 80 mm Hg  High blood pressure: Stage 1 hypertension 130-139 mm Hg or 80-89 mm Hg  High blood pressure: Stage 2 hypertension 140 mm Hg or higher or 90 mm Hg or higher  Hypertensive crisis (consult your doctor immediately) Higher than 180 mm Hg and/or Higher than 120 mm Hg  Source: American Heart Association and American Stroke Association. For more on getting your blood pressure under control, buy Controlling Your Blood  Pressure, a Special Health Report from [facility]. Medication Instructions:  Your physician has recommended you make the following change in your medication:  For BP: Top number less than 110 skip your Hydralazine In AM take the Hydralazine and Carvedilol one hour apart Take Milk of Magnesia daily in the evening  *If you need a refill on your cardiac medications before your next appointment, please call your  pharmacy*   Lab Work: NONE If you have labs (blood work) drawn today and your tests are completely normal, you will receive your results only by: [place] (if you have MyChart) OR A paper copy in the mail If you have any lab test that is abnormal or we need to change your treatment, we will call you to review the results.   Testing/Procedures: NONE   Follow-Up: At [facility], you and your health needs are our priority.  As part of our continuing mission to provide you with exceptional heart care, we have created designated Provider Care Teams.  These Care Teams include your primary Cardiologist (physician) and Advanced Practice Providers (APPs -  Physician Assistants and Nurse Practitioners) who all work together to provide you with the care you need, when you need it.  We recommend signing up for the patient portal called "MyChart".  Sign up information is provided on this After Visit Summary.  MyChart is used to connect with patients for Virtual Visits (Telemedicine).  Patients are able to view lab/test results, encounter notes, upcoming appointments, etc.  Non-urgent messages can be sent to your provider as well.   To learn more about what you can do with MyChart, go to [email or web address].    Your next appointment:   3 month(s)  The format for your next appointment:   In Person  Provider:   Shirlee More, MD    Other Instructions   Important Information About Sugar

## 2022-10-04 NOTE — Progress Notes (Signed)
Cardiology Office Note:    Date:  10/04/2022   ID:  Stephanie Evans, DOB Sep 14, 1986, MRN [id number]  PCP:  Julianne Handler, NP  Cardiologist:  Norman Herrlich, MD    Referring MD: Baldo Daub, MD    ASSESSMENT:    1. Resistant hypertension   2. Dissection of descending thoracic aorta (HCC)    PLAN:    In order of problems listed above:  This is a very complex unusual case unfortunately she had been well controlled with hypertension prior to this hospitalization I think she needs resources beyond seeing a cardiologist sporadically in the office I will refer her to our advanced hypertension program which I think will give her higher degree of oversight education medication titration and access to education and her medications.  I will plan to see her back in 3 months.  She takes a complex multidrug regimen including high-dose carvedilol clonidine 3 times a day hydralazine nifedipine and spironolactone. Stable following TEVAR MOM over-the-counter for constipation likely influenced by her calcium channel blocker   Next appointment: 3 months   Medication Adjustments/Labs and Tests Ordered: Current medicines are reviewed at length with the patient today.  Concerns regarding medicines are outlined above.  No orders of the defined types were placed in this encounter.  No orders of the defined types were placed in this encounter.   Chief Complaint  Patient presents with   Follow-up   Hypertension    She had recent distal thoracic aortic dissection and underwent TEVAR    History of Present Illness:    Stephanie Evans is a 36 y.o. female with a hx of longstanding poorly controlled hypertension seen by me in 07/20/2022 after [facility] admission with hypertensive urgency.  She subsequently presented with chest pain admitted to [facility] transfer from [facility] with aortic dissection.   Discharge summary relates type B dissection evaluated and cared for by  cardiothoracic surgery with intervention status post TEVAR 09/20/2022  Compliance with diet, lifestyle and medications: Yes since hospital discharge, my partner saw in the emergency room told me she had been noncompliant with medications may have run out prior to presenting to [facility] ED with hypertensive crisis and dissection.  She is recovered , she checks her blood pressure at home with an upper arm digital device but did not bring her list predominantly in the range of 120/70 highest 135/76 in 1 day had systolics less than 110 and was lightheaded.  I asked her to split her morning carvedilol and hydralazine by at least 1 hour to avoid this and if she has systolics less than 110 in the morning to skip her morning dose of hydralazine She is recovered from TEVAR no chest pain back pain shortness of breath palpitation or edema She is very focused on taking care of herself He has no plans for further pregnancy  Most recent labs 09/26/2022: Sodium 137 potassium 4.2 creatinine 1.15 GFR 64 cc/min hemoglobin 11.2  CTA 09/20/2022: 1.  Similar morphology and extent of the acute Stanford type B aortic dissection which originates in the distal aortic arch and terminates in the suprarenal abdominal aorta. Similar size of the false lumen with persistent filling of the false lumen as compared to prior CT.  2.  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  Past Medical History:  Diagnosis Date   Hypertension     Past Surgical History:  Procedure Laterality Date  NO PAST SURGERIES      Current Medications: Current Meds  Medication Sig   aspirin EC 81 MG tablet Take 1 tablet by mouth daily.   carvedilol (COREG) 25 MG tablet Take 25 mg by mouth in the morning and at bedtime.   cloNIDine (CATAPRES) 0.2 MG tablet Take 1 tablet by mouth 3 (three) times daily.   [employer] SENNA PLUS 8.6-50 MG tablet Take 2 tablets by mouth at bedtime.    hydrALAZINE (APRESOLINE) 25 MG tablet Take 3 tablets by mouth every 6 (six) hours.   MAGNESIUM GLYCINATE PO Take 4 tablets by mouth daily.   NIFEdipine (PROCARDIA XL/NIFEDICAL-XL) 90 MG 24 hr tablet Take 90 mg by mouth daily.   oxyCODONE (OXY IR/ROXICODONE) 5 MG immediate release tablet Take 5 mg by mouth every 6 (six) hours as needed for pain.   polyethylene glycol (MIRALAX / GLYCOLAX) 17 g packet Take 17 g by mouth in the morning and at bedtime.   spironolactone (ALDACTONE) 25 MG tablet Take 25 mg by mouth daily.     Allergies:   Lisinopril-hydrochlorothiazide   Social History   Socioeconomic History   Marital status: Significant Other    Spouse name: Not on file   Number of children: Not on file   Years of education: Not on file   Highest education level: Not on file  Occupational History   Not on file  Tobacco Use   Smoking status: Never   Smokeless tobacco: Never  Vaping Use   Vaping Use: Never used  Substance and Sexual Activity   Alcohol use: Yes    Comment: Rare   Drug use: Yes    Types: Marijuana    Comment: Daily   Sexual activity: Not on file  Other Topics Concern   Not on file  Social History Narrative   Not on file   Social Determinants of Health   Financial Resource Strain: Not on file  Food Insecurity: Not on file  Transportation Needs: Not on file  Physical Activity: Not on file  Stress: Not on file  Social Connections: Not on file     Family History: The patient's family history includes Breast cancer in her mother; Diabetes in her father and mother; Hypertension in her father and mother. ROS:   Please see the history of present illness.    All other systems reviewed and are negative.  EKGs/Labs/Other Studies Reviewed:    The following studies were reviewed today:  Recent Labs: 04/11/2022: Hemoglobin 14.9; Platelets 213 07/20/2022: BUN 19; Creatinine, Ser 1.52; Potassium 3.5; Sodium 140  Recent Lipid Panel No results found for: "CHOL",  "TRIG", "HDL", "CHOLHDL", "VLDL", "LDLCALC", "LDLDIRECT"  Physical Exam:    VS:  BP 128/62 (BP Location: Left Arm, Patient Position: Sitting)   Pulse 78   Ht 5' 3.5" (1.613 m)   Wt 133 lb 9.6 oz (60.6 kg)   SpO2 98%   BMI 23.29 kg/m     Wt Readings from Last 3 Encounters:  10/04/22 133 lb 9.6 oz (60.6 kg)  07/20/22 152 lb (68.9 kg)  05/31/22 156 lb (70.8 kg)     GEN:  Well nourished, well developed in no acute distress HEENT: Normal NECK: No JVD; No carotid bruits LYMPHATICS: No lymphadenopathy CARDIAC: RRR, no murmurs, rubs, gallops RESPIRATORY:  Clear to auscultation without rales, wheezing or rhonchi  ABDOMEN: Soft, non-tender, non-distended MUSCULOSKELETAL:  No edema; No deformity  SKIN: Warm and dry NEUROLOGIC:  Alert and oriented x 3 PSYCHIATRIC:  Normal affect  Signed, Shirlee More, MD  10/04/2022 8:40 AM    [place]

## 2022-10-07 ENCOUNTER — Encounter: Payer: Self-pay | Admitting: Cardiology

## 2022-10-10 ENCOUNTER — Telehealth: Payer: Self-pay | Admitting: Cardiology

## 2022-10-10 DIAGNOSIS — Z0279 Encounter for issue of other medical certificate: Secondary | ICD-10-CM

## 2022-10-10 NOTE — Telephone Encounter (Signed)
Forms from [employer] FMLA paperwork received on 10/10/22, complete auth attached along with $29 dollar fee collected and placed in safe. Took form to MD box for completion. 10/10/22 KBL

## 2022-10-11 ENCOUNTER — Telehealth: Payer: Self-pay

## 2022-10-11 NOTE — Telephone Encounter (Signed)
FMLA form completed by Dr Shirlee More and form faxed to [place] / [employer]. Forms scanned to chart media

## 2022-10-18 ENCOUNTER — Telehealth: Payer: Self-pay | Admitting: Cardiology

## 2022-10-18 NOTE — Telephone Encounter (Signed)
Calling in regards to pt's Short Term Disability paperwork. Cecille Rubin states that she did not receive Short Term Disability( 3 pages) part of the paperwork that was faxed. She needs this sent over as soon as possible for pt. She would like a callback regarding this matter. Please advise

## 2022-10-19 NOTE — Telephone Encounter (Signed)
Cecille Rubin calling back saying she has not received fax and she is requesting it be sent again. She did confirm that that was the right fax number.  Fax: [phone]

## 2022-10-19 NOTE — Telephone Encounter (Addendum)
Left message for Stephanie Evans @ [phone] with [employer] that forms have been faxed again today.

## 2022-11-23 ENCOUNTER — Encounter (HOSPITAL_BASED_OUTPATIENT_CLINIC_OR_DEPARTMENT_OTHER): Payer: Self-pay | Admitting: Cardiovascular Disease

## 2022-11-23 ENCOUNTER — Ambulatory Visit (INDEPENDENT_AMBULATORY_CARE_PROVIDER_SITE_OTHER): Payer: Managed Care, Other (non HMO) | Admitting: Cardiovascular Disease

## 2022-11-23 VITALS — BP 172/98 | HR 58 | Ht 63.5 in | Wt 138.1 lb

## 2022-11-23 DIAGNOSIS — I1A0 Resistant hypertension: Secondary | ICD-10-CM

## 2022-11-23 DIAGNOSIS — Z006 Encounter for examination for normal comparison and control in clinical research program: Secondary | ICD-10-CM

## 2022-11-23 MED ORDER — CHLORTHALIDONE 25 MG PO TABS: 25.0000 mg | ORAL_TABLET | Freq: Every day | ORAL | 3 refills | Status: DC

## 2022-11-23 MED ORDER — HYDRALAZINE HCL 100 MG PO TABS: 100.0000 mg | ORAL_TABLET | Freq: Two times a day (BID) | ORAL | 3 refills | Status: DC

## 2022-11-23 MED ORDER — CLONIDINE HCL 0.2 MG PO TABS: 0.2000 mg | ORAL_TABLET | Freq: Two times a day (BID) | ORAL | 3 refills | Status: DC

## 2022-11-23 NOTE — Patient Instructions (Signed)
Medication Instructions:  CHANGE YOUR CLONIDINE TO EVERY 12 HOURS   CHANGE YOUR HYDRALAZINE TO 100 MG EVERY 12 HOURS   START CHLORTHALIDONE 25 MG DAILY   Labwork: BMET/TSH 1 WEEK   24 HOUR URINE (CATACHOLAMINES/METANEPHRINES) AFTER YOU HAVE ANOTHER SPIKE IN YOUR BLOOD PRESSURE   Testing/Procedures: NONE   Follow-Up: WILL CALL YOU WITH APPOINTMENT   Any Other Special Instructions Will Be Listed Below (If Applicable). MONITOR YOUR BLOOD PRESSURE TWICE A DAY, LOG IN THE BOOK PROVIDED. BRING THE BOOK AND YOUR BLOOD PRESSURE MACHINE TO YOUR FOLLOW UP IN 1 MONTH   If you need a refill on your cardiac medications before your next appointment, please call your pharmacy.

## 2022-11-23 NOTE — Assessment & Plan Note (Signed)
She has very resistant hypertension.  Blood pressures at home have been better but still somewhat labile.  We will get a 24-hour urine for catecholamines and metanephrines if she has another episode of her blood pressure spiking.  Otherwise evaluation for secondary causes has been unremarkable.  We will check a TSH.  She has a family history of very early onset hypertension and I suspect that her etiology is genetic.  She will start to gradually increase her exercise.  We discussed the importance of limiting all sodium intake.  Her goal is less than 2300 mg a day.  She is struggling with symptoms from clonidine.  We will switch it to 0.2 mg twice daily with a goal of trying to wean it off if possible.  Continue carvedilol, nifedipine, and spironolactone.  We will transition the hydralazine to 100 mg twice daily instead of 25 mg every 6 hours.  Hopefully this will help her pill burden and help her to be more adherent to her medications.  She has done a great job of checking her blood pressures twice daily and taking her medicine in general.  Today she did forget to take it prior to leaving the house.  She notes that she has had some lightheadedness and dizziness and that she is not quite used to her blood pressures being in the 110s to 120s.  For now we will aim to keep her blood pressure under 140 and continue to gradually lower her goal to less than 130 over the next couple months.  She is not a good candidate for renal denervation due to her history of dissection down to the level of the SMA.

## 2022-11-23 NOTE — Research (Signed)
  Subject Name: Stephanie Evans met inclusion and exclusion criteria for the Virtual Care and Social Determinant Interventions for the management of hypertension trial.  The informed consent form, study requirements and expectations were reviewed with the subject by Dr. Oval Linsey and myself. The subject was given the opportunity to read the consent and ask questions. The subject verbalized understanding of the trial requirements.  All questions were addressed prior to the signing of the consent form. The subject agreed to participate in the trial and signed the informed consent. The informed consent was obtained prior to performance of any protocol-specific procedures for the subject.  A copy of the signed informed consent was given to the subject and a copy was placed in the subject's medical record.  Stephanie Evans was randomized to Group 1.

## 2022-11-23 NOTE — Progress Notes (Signed)
Advanced Hypertension Clinic Initial Assessment:    Date:  11/23/2022   ID:  Stephanie Evans, DOB 1986/06/16, MRN [id number]  PCP:  Julianne Handlerouth, Sarah T, NP  Cardiologist:  None  Nephrologist:  Referring MD: Baldo DaubMunley, Brian J, MD   CC: Hypertension  History of Present Illness:    Stephanie Evans is a 36 y.o. female with a hx of hypertension, aortic dissection, and CKD III, here to establish care in the Advanced Hypertension Clinic. She initially saw Dr. Dulce SellarMunley 07/2022 after an admission for hypertensive urgency. She was not on any medications prior to this event and was started on amlodipine. She first developed hypertension at age 36. She has a history of anaphylaxis with an ACE inhibitor. She was working on lifestyle and had lost 52 lbs, but blood pressure remained in the 200s/130s. Labs were negative for hyperaldosteronism. Metanephrines were normal. Renal artery dopplers were normal 07/2022. She was admitted to the hospital 09/2022 with a type B aortic dissection. She underwent TEVAR 09/20/22. She followed up with Dr. Dulce SellarMunley later that month and her home blood pressures were in the 110s-130s/70s. At the time, she was on carvedilol, clonidine, hydralazine, nifedipine, and spironolactone. She had an Echo 08/2022 that revealed LVEF 65-70% with normal diastolic function.   Today, she is accompanied by her significant other. She confirms struggling with hypertension for years. She was initially diagnosed when she was about 36 yo. Her first pregnancy was at 36 yo. In clinic today her blood pressure is 192/101 (172/98 on recheck) which she attributes to forgetting her medications this morning. Her significant other notes that in the past her blood pressure has suddenly spiked to as high as 260 systolic. She presents a BP log showing some high readings such as 170/100. These readings are prior to taking her antihypertensives around 8 AM. If her blood pressure is ever low such as in the 110s-120s she will develop  lightheadedness. Occasionally she complains of sudden drowsiness due to her clonidine. Previously when she was taking lisnopril she fainted a few times. She was given lisinopril/HCTZ but she still lost consciousness. Even without medication, she did not notice improvement in her blood pressures when focusing on healthy diet and exercise. Lately she has not been exercising since her TEVAR. She was told that she did not need cardiac rehab. Typically she follows a mixture of cooking meals at home and ordering out. She is monitoring her salt intake. At one time she only used Ms. Dash, but is now incorporating limited pink Himalayan salt. Every other day she has about 1 cup of a caffeinated beverage. Alcohol consumption is occasional, usually a glass of wine. For pain management she tries not to take any OTC medications. If she needs to she would take tylenol. She confirms a history of smoking marijuana, but no history of smoking tobacco. Occasionally she snores; usually feels rested in the mornings. She denies any palpitations, chest pain, shortness of breath, or peripheral edema. No headaches, orthopnea, or PND.  Previous antihypertensives: Lisinopril - syncope  Past Medical History:  Diagnosis Date   Hypertension    Resistant hypertension 09/18/2022    Past Surgical History:  Procedure Laterality Date   NO PAST SURGERIES      Current Medications: Current Meds  Medication Sig   aspirin EC 81 MG tablet Take 1 tablet by mouth daily.   carvedilol (COREG) 25 MG tablet Take 25 mg by mouth in the morning and at bedtime.   chlorthalidone (HYGROTON) 25 MG tablet Take 1  tablet (25 mg total) by mouth daily.   [employer] SENNA PLUS 8.6-50 MG tablet Take 2 tablets by mouth at bedtime.   MAGNESIUM GLYCINATE PO Take 4 tablets by mouth daily.   NIFEdipine (PROCARDIA XL/NIFEDICAL-XL) 90 MG 24 hr tablet Take 90 mg by mouth daily.   oxyCODONE (OXY IR/ROXICODONE) 5 MG immediate release tablet Take 5 mg by mouth every 6  (six) hours as needed for pain.   polyethylene glycol (MIRALAX / GLYCOLAX) 17 g packet Take 17 g by mouth in the morning and at bedtime.   spironolactone (ALDACTONE) 25 MG tablet Take 25 mg by mouth daily.   [DISCONTINUED] cloNIDine (CATAPRES) 0.2 MG tablet Take 1 tablet by mouth 3 (three) times daily.   [DISCONTINUED] hydrALAZINE (APRESOLINE) 25 MG tablet Take 3 tablets by mouth every 6 (six) hours.     Allergies:   Lisinopril   Social History   Socioeconomic History   Marital status: Significant Other    Spouse name: Not on file   Number of children: Not on file   Years of education: Not on file   Highest education level: Not on file  Occupational History   Not on file  Tobacco Use   Smoking status: Never   Smokeless tobacco: Never  Vaping Use   Vaping Use: Never used  Substance and Sexual Activity   Alcohol use: Yes    Comment: Rare   Drug use: Yes    Types: Marijuana    Comment: Daily   Sexual activity: Not on file  Other Topics Concern   Not on file  Social History Narrative   Not on file   Social Determinants of Health   Financial Resource Strain: Not on file  Food Insecurity: No Food Insecurity (11/23/2022)   Hunger Vital Sign    Worried About Running Out of Food in the Last Year: Never true    Ran Out of Food in the Last Year: Never true  Transportation Needs: No Transportation Needs (11/23/2022)   PRAPARE - Administrator, Civil Service (Medical): No    Lack of Transportation (Non-Medical): No  Physical Activity: Inactive (11/23/2022)   Exercise Vital Sign    Days of Exercise per Week: 0 days    Minutes of Exercise per Session: 0 min  Stress: Not on file  Social Connections: Not on file     Family History: The patient's family history includes Breast cancer in her mother; Diabetes in her father and mother; Hypertension in her father, mother, and sister; Sleep apnea in her father.  ROS:   Please see the history of present illness.    (+)  Snoring All other systems reviewed and are negative.  EKGs/Labs/Other Studies Reviewed:    CTA Chest/Abdomen/Pelvis  10/27/2022 ([facility]): CONCLUSION:  Postprocedural changes of thoracic endograft placement with improved caliber of the true lumen and decreased size but persistent flow within the false lumen of the patient's prior type B aortic aneurysm. Majority of this flow appears retrograde via the distal thoracic/proximal suprarenal abdominal aorta intraluminal defect, however, there is a small persistent intraluminal defect contributing to flow just distal to the left subclavian branch as further described above.   TTE  09/16/2022  ([facility]): SUMMARY  The left ventricular size is normal with normal left ventricular wall  thickness.  Left ventricular systolic function is normal.  LV ejection fraction = 65-70%.  The right ventricle is normal in size and function.  There is no significant valvular stenosis or  regurgitation.   Linear echodensity seen in abdominal aorta consistent with dissection  flap.   The IVC is normal in size with an inspiratory collapse of greater then  50%, suggesting normal right atrial pressure.  There is no pericardial effusion.  There is no comparison study available.   Bilateral Renal Artery Dopplers  07/21/2022: Summary:  Largest Aortic Diameter: 2.3 cm    Renal:    Right: Normal size right kidney. Normal right Resisitive Index. No         evidence of right renal artery stenosis.  Left:  Normal size of left kidney. Normal left Resistive Index. No         evidence of left renal artery stenosis.  Mesenteric:  Normal Superior Mesenteric artery and Celiac artery findings.   CTA Chest  09/26/2021: IMPRESSION: No pulmonary embolism. No radiographic evidence of acute cardiopulmonary disease.  EKG:  EKG is personally reviewed. 11/23/2022: EKG was not ordered.   Recent Labs: 04/11/2022: Hemoglobin 14.9; Platelets 213 07/20/2022: BUN  19; Creatinine, Ser 1.52; Potassium 3.5; Sodium 140   Recent Lipid Panel No results found for: "CHOL", "TRIG", "HDL", "CHOLHDL", "VLDL", "LDLCALC", "LDLDIRECT"  Physical Exam:    VS:  BP (!) 172/98 (BP Location: Right Arm, Patient Position: Sitting, Cuff Size: Normal)   Pulse (!) 58   Ht 5' 3.5" (1.613 m)   Wt 138 lb 1.6 oz (62.6 kg)   SpO2 100%   BMI 24.08 kg/m  , BMI Body mass index is 24.08 kg/m. GENERAL:  Well appearing HEENT: Pupils equal round and reactive, fundi not visualized, oral mucosa unremarkable NECK:  No jugular venous distention, waveform within normal limits, carotid upstroke brisk and symmetric, no bruits, no thyromegaly LUNGS:  Clear to auscultation bilaterally HEART:  RRR.  PMI not displaced or sustained,S1 and S2 within normal limits, no S3, no S4, no clicks, no rubs, no murmurs ABD:  Flat, positive bowel sounds normal in frequency in pitch, no bruits, no rebound, no guarding, no midline pulsatile mass, no hepatomegaly, no splenomegaly EXT:  2 plus pulses throughout, no edema, no cyanosis no clubbing SKIN:  No rashes no nodules NEURO:  Cranial nerves II through XII grossly intact, motor grossly intact throughout PSYCH:  Cognitively intact, oriented to person place and time   ASSESSMENT/PLAN:    Resistant hypertension She has very resistant hypertension.  Blood pressures at home have been better but still somewhat labile.  We will get a 24-hour urine for catecholamines and metanephrines if she has another episode of her blood pressure spiking.  Otherwise evaluation for secondary causes has been unremarkable.  We will check a TSH.  She has a family history of very early onset hypertension and I suspect that her etiology is genetic.  She will start to gradually increase her exercise.  We discussed the importance of limiting all sodium intake.  Her goal is less than 2300 mg a day.  She is struggling with symptoms from clonidine.  We will switch it to 0.2 mg twice daily  with a goal of trying to wean it off if possible.  Continue carvedilol, nifedipine, and spironolactone.  We will transition the hydralazine to 100 mg twice daily instead of 25 mg every 6 hours.  Hopefully this will help her pill burden and help her to be more adherent to her medications.  She has done a great job of checking her blood pressures twice daily and taking her medicine in general.  Today she did forget to take it prior to  leaving the house.  She notes that she has had some lightheadedness and dizziness and that she is not quite used to her blood pressures being in the 110s to 120s.  For now we will aim to keep her blood pressure under 140 and continue to gradually lower her goal to less than 130 over the next couple months.  She is not a good candidate for renal denervation due to her history of dissection down to the level of the SMA.   Screening for Secondary Hypertension:     11/23/2022    9:26 AM  Causes  Drugs/Herbals Screened     - Comments limits sodium intake,  cooks and eats out, one coffee daily  Renovascular HTN Screened  Sleep Apnea Screened     - Comments snores.  no other symptoms  Coarctation of the Aorta Screened     - Comments BP symmetric    Relevant Labs/Studies:    Latest Ref Rng & Units 07/20/2022   10:31 AM 04/11/2022    2:07 PM 09/26/2021    1:09 PM  Basic Labs  Sodium 134 - 144 mmol/L 140  138  135   Potassium 3.5 - 5.2 mmol/L 3.5  3.1  3.1   Creatinine 0.57 - 1.00 mg/dL 1.51  7.61  6.07           Latest Ref Rng & Units 07/20/2022   10:31 AM  Renin/Aldosterone   Aldosterone 0.0 - 30.0 ng/dL 37.1   Renin 0.626 - 9.485 ng/mL/hr 7.362   Aldos/Renin Ratio 0.0 - 30.0 2.8              07/21/2022    8:31 AM  Renovascular   Renal Artery [place] Completed Yes     Disposition:    FU with APP/PharmD in 1 month for the next 3 months.   FU with Hillery Bhalla C. Duke Salvia, MD, [facility] in 4 months.  Medication Adjustments/Labs and Tests Ordered: Current medicines are  reviewed at length with the patient today.  Concerns regarding medicines are outlined above.   Orders Placed This Encounter  Procedures   TSH   Basic metabolic panel   Catecholamines, fractionated, urine, 24 hour   Metanephrines, urine, 24 hour   Meds ordered this encounter  Medications   cloNIDine (CATAPRES) 0.2 MG tablet    Sig: Take 1 tablet (0.2 mg total) by mouth 2 (two) times daily.    Dispense:  180 tablet    Refill:  3    NEW DOSE, D/C PREVIOUS RX   hydrALAZINE (APRESOLINE) 100 MG tablet    Sig: Take 1 tablet (100 mg total) by mouth every 12 (twelve) hours.    Dispense:  180 tablet    Refill:  3    NEW DOSE, D/C PREVIOUS RX   chlorthalidone (HYGROTON) 25 MG tablet    Sig: Take 1 tablet (25 mg total) by mouth daily.    Dispense:  90 tablet    Refill:  3   I,Mathew Stumpf,acting as a scribe for Chilton Si, MD.,have documented all relevant documentation on the behalf of Chilton Si, MD,as directed by  Chilton Si, MD while in the presence of Chilton Si, MD.  I, Lyne Khurana C. Duke Salvia, MD have reviewed all documentation for this visit.  The documentation of the exam, diagnosis, procedures, and orders on 11/23/2022 are all accurate and complete.   Signed, Chilton Si, MD  11/23/2022 1:15 PM    [facility]

## 2022-12-30 ENCOUNTER — Ambulatory Visit (INDEPENDENT_AMBULATORY_CARE_PROVIDER_SITE_OTHER): Payer: Managed Care, Other (non HMO) | Admitting: Pharmacist Clinician (PhC)/ Clinical Pharmacy Specialist

## 2022-12-30 VITALS — BP 137/85 | HR 64 | Ht 63.5 in | Wt 130.2 lb

## 2022-12-30 DIAGNOSIS — I1A0 Resistant hypertension: Secondary | ICD-10-CM

## 2022-12-30 MED ORDER — NIFEDIPINE ER OSMOTIC RELEASE 90 MG PO TB24: 90.0000 mg | ORAL_TABLET | Freq: Every day | ORAL | 3 refills | Status: DC

## 2022-12-30 MED ORDER — CARVEDILOL 25 MG PO TABS: 25.0000 mg | ORAL_TABLET | Freq: Two times a day (BID) | ORAL | 3 refills | Status: DC

## 2022-12-30 MED ORDER — CLONIDINE 0.2 MG/24HR TD PTWK: 0.2000 mg | MEDICATED_PATCH | TRANSDERMAL | 3 refills | Status: DC

## 2022-12-30 NOTE — Progress Notes (Signed)
Office Visit    Patient Name: Stephanie Evans Date of Encounter: 12/31/2022  Primary Care Provider:  Julianne Handler, NP Primary Cardiologist:  Chilton Si MD  Chief Complaint    Hypertension - Advanced hypertension clinic  Past Medical History   CKD Stage 3 - due to longstanding hypertension  Aortic dissection Surgery 08/2022    Allergies  Allergen Reactions   Lisinopril     syncope    History of Present Illness    Stephanie Evans is a 37 y.o. female patient who was referred to the Advanced Hypertension Clinic by Dr. Dulce Sellar.  Patient has history of hypertension, being diagnosed at the age of 79.  She was admitted to [facility] last summer for hypertensive emergency.  She notes that home pressures will occasionally spike to as high as 260 systolic, and when it drops to 110-120 range she will feel lightheaded.   She saw Dr. Duke Salvia in early December, with a BP of 172/98, suspected to be herediary and not secondary.  Patient will do 24 hour urine collection for metanephrines and catecholamines.  Colchicine and hydralazine were both switched to twice daily for better compliance.  Today she returns for follow up.  Interestingly her morning BP average over the past month was 136/82, however the range was still 75 points systolic and 68 points diastolic in the mornings (see below).   She appears to be compliant with medications when she has them - ran out of nifedipine about a week ago and hasn't been able to get refilled.   Confirmed that patient is not planning any more children.  Blood Pressure Goal:  130/80  Current Medications: chlorthalidone 25 mg qd, clonidine 0.2 mg bid, hydralazine 100 mg bid, nifedipine xl 90 mg qd, spironolactone 25 mg qd, carvedilol 25 mg bid  Adherence Assessment  Do you ever forget to take your medication? [x] Yes only occasionally [] No  Do you ever skip doses due to side effects? [] Yes [x] No  Do you have trouble affording your medicines?  [] Yes [x] No  Are you ever unable to pick up your medication due to transportation difficulties? [] Yes [x] No  Do you ever stop taking your medications because you don't believe they are helping? [] Yes [x] No    Previously tried:   lisinopril - syncope  Family Hx:   father also with hypertension ; sister also has hyperension, but not as labile; 3 kids oldest 43, 24, 70, no more planned  Social Hx:      Tobacco: no, but occasional marijuana  Alcohol: occasionally, may have more than 2 per day  Caffeine:  occassional coffee, avoids soda  Diet:    mix of home and eating out ; has 3 kids to feed - late lunch at fast food to keep kids goin; no pork, low beef; more poultry; lots of vegetables, doesn't snack much  Exercise: started yoga last week, chasing 3 kids; wants to start Cross fit on Monday  Home BP readings:    33 AM readings average 136/82 (range 90-165/61-129) 33 PM readings average 153/96 (range 114-182/67-121)   Accessory Clinical Findings    Lab Results  Component Value Date   CREATININE 1.52 (H) 07/20/2022   BUN 19 07/20/2022   NA 140 07/20/2022   K 3.5 07/20/2022   CL 99 07/20/2022   CO2 26 07/20/2022   Lab Results  Component Value Date   ALT 12 09/26/2021   AST 14 (L) 09/26/2021   ALKPHOS 44 09/26/2021   BILITOT 1.7 (H) 09/26/2021  No results found for: "HGBA1C"  Screening for Secondary Hypertension:      11/23/2022    9:26 AM  Causes  Drugs/Herbals Screened     - Comments limits sodium intake,  cooks and eats out, one coffee daily  Renovascular HTN Screened  Sleep Apnea Screened     - Comments snores.  no other symptoms  Coarctation of the Aorta Screened     - Comments BP symmetric    Relevant Labs/Studies:    Latest Ref Rng & Units 07/20/2022   10:31 AM 04/11/2022    2:07 PM 09/26/2021    1:09 PM  Basic Labs  Sodium 134 - 144 mmol/L 140  138  135   Potassium 3.5 - 5.2 mmol/L 3.5  3.1  3.1   Creatinine 0.57 - 1.00 mg/dL 1.52  1.29  1.50            Latest Ref Rng & Units 07/20/2022   10:31 AM  Renin/Aldosterone   Aldosterone 0.0 - 30.0 ng/dL 20.9   Renin 0.167 - 5.380 ng/mL/hr 7.362   Aldos/Renin Ratio 0.0 - 30.0 2.8              07/21/2022    8:31 AM  Renovascular   Renal Artery [place] Completed Yes      Home Medications    Current Outpatient Medications  Medication Sig Dispense Refill   carvedilol (COREG) 25 MG tablet Take 1 tablet (25 mg total) by mouth 2 (two) times daily. 180 tablet 3   cloNIDine (CATAPRES - DOSED IN MG/24 HR) 0.2 mg/24hr patch Place 1 patch (0.2 mg total) onto the skin once a week. 12 patch 3   chlorthalidone (HYGROTON) 25 MG tablet Take 1 tablet (25 mg total) by mouth daily. 90 tablet 3   [employer] SENNA PLUS 8.6-50 MG tablet Take 2 tablets by mouth at bedtime.     hydrALAZINE (APRESOLINE) 100 MG tablet Take 1 tablet (100 mg total) by mouth every 12 (twelve) hours. 180 tablet 3   MAGNESIUM GLYCINATE PO Take 4 tablets by mouth daily.     NIFEdipine (PROCARDIA XL/NIFEDICAL-XL) 90 MG 24 hr tablet Take 1 tablet (90 mg total) by mouth daily. 90 tablet 3   oxyCODONE (OXY IR/ROXICODONE) 5 MG immediate release tablet Take 5 mg by mouth every 6 (six) hours as needed for pain.     polyethylene glycol (MIRALAX / GLYCOLAX) 17 g packet Take 17 g by mouth in the morning and at bedtime.     spironolactone (ALDACTONE) 25 MG tablet Take 25 mg by mouth daily.     No current facility-administered medications for this visit.     Assessment & Plan      Resistant hypertension Assessment: BP is uncontrolled in office BP 133/80 mmHg;  above the goal (<130/80). She has been out of nifedipine xl 90 mg for almost a week - states pharmacy waiting to hear from MD office Tolerates current well without any side effects  Denies SOB, palpitation, chest pain, headaches,or swelling Reiterated the importance of regular exercise and low salt diet   Plan:  Start taking clonidine patches 0.2 mg weekly Take normal clonidine tablet dose  0.2 mg bid on day that patch is applied Next day take 0.1 mg clonidine bid Third day take 0.1 mg clonidine at hs only Continue taking all other medications Patient to keep record of BP readings with heart rate and report to [place] at the next visit Patient to follow up with PharmD in 1 month  for follow up  Labs ordered today:      Tommy Medal PharmD [facility]  [address] [place], [place] [address] [phone]

## 2022-12-30 NOTE — Patient Instructions (Signed)
  Go to the lab to see about urine test  Take your BP meds as follows:  Start clonidine patches - 0.2 mg patch once weekly  On the day you apply first patch take clonidine 0.2 mg tablet twice daily  Next day take clonidine 0.1 mg (1/2 tablet) twice daily  Third day take clonidine 0.1 mg at bedtime only   Continue with your other medications.  Take the nifedipine 90 mg at night  Check your blood pressure at home daily (if able) and keep record of the readings.  Hypertension "High blood pressure"  Hypertension is often called "The Silent Killer." It rarely causes symptoms until it is extremely  high or has done damage to other organs in the body. For this reason, you should have your  blood pressure checked regularly by your physician. We will check your blood pressure  every time you see a provider at one of our offices.   Your blood pressure reading consists of two numbers. Ideally, blood pressure should be  below 120/80. The first ("top") number is called the systolic pressure. It measures the  pressure in your arteries as your heart beats. The second ("bottom") number is called the diastolic pressure. It measures the pressure in your arteries as the heart relaxes between beats.  The benefits of getting your blood pressure under control are enormous. A 10-point  reduction in systolic blood pressure can reduce your risk of stroke by 27% and heart failure by 28%  Your blood pressure goal is 140/80  To check your pressure at home you will need to:  1. Sit up in a chair, with feet flat on the floor and back supported. Do not cross your ankles or legs. 2. Rest your left arm so that the cuff is about heart level. If the cuff goes on your upper arm,  then just relax the arm on the table, arm of the chair or your lap. If you have a wrist cuff, we  suggest relaxing your wrist against your chest (think of it as Pledging the Flag with the  wrong arm).  3. Place the cuff snugly around  your arm, about 1 inch above the crook of your elbow. The  cords should be inside the groove of your elbow.  4. Sit quietly, with the cuff in place, for about 5 minutes. After that 5 minutes press the power  button to start a reading. 5. Do not talk or move while the reading is taking place.  6. Record your readings on a sheet of paper. Although most cuffs have a memory, it is often  easier to see a pattern developing when the numbers are all in front of you.  7. You can repeat the reading after 1-3 minutes if it is recommended  Make sure your bladder is empty and you have not had caffeine or tobacco within the last 30 min  Always bring your blood pressure log with you to your appointments. If you have not brought your monitor in to be double checked for accuracy, please bring it to your next appointment.  You can find a list of quality blood pressure cuffs at validatebp.org

## 2022-12-31 ENCOUNTER — Encounter (HOSPITAL_BASED_OUTPATIENT_CLINIC_OR_DEPARTMENT_OTHER): Payer: Self-pay | Admitting: Pharmacist Clinician (PhC)/ Clinical Pharmacy Specialist

## 2022-12-31 NOTE — Assessment & Plan Note (Signed)
Assessment: BP is uncontrolled in office BP 133/80 mmHg;  above the goal (<130/80). She has been out of nifedipine xl 90 mg for almost a week - states pharmacy waiting to hear from MD office Tolerates current well without any side effects  Denies SOB, palpitation, chest pain, headaches,or swelling Reiterated the importance of regular exercise and low salt diet   Plan:  Start taking clonidine patches 0.2 mg weekly Take normal clonidine tablet dose 0.2 mg bid on day that patch is applied Next day take 0.1 mg clonidine bid Third day take 0.1 mg clonidine at hs only Continue taking all other medications Patient to keep record of BP readings with heart rate and report to [place] at the next visit Patient to follow up with PharmD in 1 month for follow up  Labs ordered today:

## 2023-01-03 NOTE — Progress Notes (Signed)
Cardiology Office Note:    Date:  01/04/2023   ID:  Stephanie Evans, DOB [birth year], MRN [id number]  PCP:  Elenore Paddy, NP  Cardiologist:  Shirlee More, MD    Referring MD: Elenore Paddy, NP    ASSESSMENT:    1. Resistant hypertension   2. Dissection of descending thoracic aorta (HCC)    PLAN:    In order of problems listed above:  In some aspects doing better Home blood pressure generally runs 1what are we talking 150-160/90-100 or greater being managed by the advanced hypertension program and stressed the importance of good technique timing of blood pressure medications changing treatment I will plan to see her back in 6 months and she will continue her current multidrug regimen for resistant hypertension including thiazide diuretic MRA beta-blocker carvedilol clonidine patch hydralazine and nifedipine Recheck her renal function Good result from TEVAR  given  activity recommendations   Next appointment: 6 months   Medication Adjustments/Labs and Tests Ordered: Current medicines are reviewed at length with the patient today.  Concerns regarding medicines are outlined above.  No orders of the defined types were placed in this encounter.  No orders of the defined types were placed in this encounter.   Follow for hypertension.  Seen and being managed by [facility] hypertension program including aspirin  History of Present Illness:    Stephanie Evans is a 37 y.o. female with a hx of resistant hypertension complicated by thoracic aortic dissection type B with TEVAR 09/20/2022 last seen 10/04/2022.  To optimize blood pressure control I have referred her to the advanced hypertension clinic [facility].  Compliance with diet, lifestyle and medications: Yes  She is doing much better with the intense/advanced hypertension program his overall clinical trial She is using a little bit erratic times to check her blood pressures she is doing it first thing in the morning and I  told her to report to have the numbers are also important to check her regular time after medication 5 minutes her breasts back and feet support and reported that number also. She feels well involved in a walking program is having no edema shortness of breath chest pain palpitation or syncope .  She asked me about activity and I told her things like walking and cycling tennis likely frequent but not high intensity activities or any contact sports Past Medical History:  Diagnosis Date   Hypertension    Resistant hypertension 09/18/2022    Past Surgical History:  Procedure Laterality Date   NO PAST SURGERIES      Current Medications: No outpatient medications have been marked as taking for the 01/04/23 encounter (Office Visit) with Richardo Priest, MD.     Allergies:   Lisinopril   Social History   Socioeconomic History   Marital status: Significant Other    Spouse name: Not on file   Number of children: Not on file   Years of education: Not on file   Highest education level: Not on file  Occupational History   Not on file  Tobacco Use   Smoking status: Never   Smokeless tobacco: Never  Vaping Use   Vaping Use: Never used  Substance and Sexual Activity   Alcohol use: Yes    Comment: Rare   Drug use: Yes    Types: Marijuana    Comment: Daily   Sexual activity: Not on file  Other Topics Concern   Not on file  Social History Narrative   Not on  file   Social Determinants of Health   Financial Resource Strain: Not on file  Food Insecurity: No Food Insecurity (11/23/2022)   Hunger Vital Sign    Worried About Running Out of Food in the Last Year: Never true    Ran Out of Food in the Last Year: Never true  Transportation Needs: No Transportation Needs (11/23/2022)   PRAPARE - Hydrologist (Medical): No    Lack of Transportation (Non-Medical): No  Physical Activity: Inactive (11/23/2022)   Exercise Vital Sign    Days of Exercise per Week: 0  days    Minutes of Exercise per Session: 0 min  Stress: Not on file  Social Connections: Not on file     Family History: The patient's family history includes Breast cancer in her mother; Diabetes in her father and mother; Hypertension in her father, mother, and sister; Sleep apnea in her father. ROS:   Please see the history of present illness.    All other systems reviewed and are negative.  EKGs/Labs/Other Studies Reviewed:    The following studies were reviewed today:  Recent Labs: 04/11/2022: Hemoglobin 14.9; Platelets 213 07/20/2022: BUN 19; Creatinine, Ser 1.52; Potassium 3.5; Sodium 140  Recent Lipid Panel No results found for: "CHOL", "TRIG", "HDL", "CHOLHDL", "VLDL", "LDLCALC", "LDLDIRECT"  Physical Exam:    VS:  BP 110/74 (BP Location: Left Arm, Patient Position: Sitting, Cuff Size: Normal)     Wt Readings from Last 3 Encounters:  12/30/22 130 lb 3.2 oz (59.1 kg)  11/23/22 138 lb 1.6 oz (62.6 kg)  10/04/22 133 lb 9.6 oz (60.6 kg)     GEN:  Well nourished, well developed in no acute distress HEENT: Normal NECK: No JVD; No carotid bruits LYMPHATICS: No lymphadenopathy CARDIAC: RRR, no murmurs, rubs, gallops RESPIRATORY:  Clear to auscultation without rales, wheezing or rhonchi  ABDOMEN: Soft, non-tender, non-distended MUSCULOSKELETAL:  No edema; No deformity  SKIN: Warm and dry NEUROLOGIC:  Alert and oriented x 3 PSYCHIATRIC:  Normal affect    Signed, Shirlee More, MD  01/04/2023 9:35 AM    [place]

## 2023-01-04 ENCOUNTER — Encounter: Payer: Self-pay | Admitting: Cardiology

## 2023-01-04 ENCOUNTER — Ambulatory Visit: Payer: Managed Care, Other (non HMO) | Attending: Cardiology | Admitting: Cardiology

## 2023-01-04 VITALS — BP 110/74 | HR 86 | Ht 63.5 in | Wt 135.4 lb

## 2023-01-04 DIAGNOSIS — I1A0 Resistant hypertension: Secondary | ICD-10-CM | POA: Diagnosis not present

## 2023-01-04 DIAGNOSIS — I71012 Dissection of descending thoracic aorta: Secondary | ICD-10-CM

## 2023-01-04 LAB — BASIC METABOLIC PANEL
BUN/Creatinine Ratio: 17 (ref 9–23)
BUN: 22 mg/dL — ABNORMAL HIGH (ref 6–20)
CO2: 23 mmol/L (ref 20–29)
Calcium: 9.7 mg/dL (ref 8.7–10.2)
Chloride: 99 mmol/L (ref 96–106)
Creatinine, Ser: 1.28 mg/dL — ABNORMAL HIGH (ref 0.57–1.00)
Glucose: 110 mg/dL — ABNORMAL HIGH (ref 70–99)
Potassium: 3.5 mmol/L (ref 3.5–5.2)
Sodium: 138 mmol/L (ref 134–144)
eGFR: 56 mL/min/{1.73_m2} — ABNORMAL LOW (ref >59)

## 2023-01-04 NOTE — Addendum Note (Signed)
Addended by: Edwyna Shell I on: 01/04/2023 10:01 AM   Modules accepted: Orders

## 2023-01-04 NOTE — Patient Instructions (Addendum)
Medication Instructions:  Your physician recommends that you continue on your current medications as directed. Please refer to the Current Medication list given to you today.  *If you need a refill on your cardiac medications before your next appointment, please call your pharmacy*   Lab Work: Your physician recommends that you return for lab work in:   Labs today: BMP  If you have labs (blood work) drawn today and your tests are completely normal, you will receive your results only by: [facility] (if you have [place]) OR A paper copy in the mail If you have any lab test that is abnormal or we need to change your treatment, we will call you to review the results.   Testing/Procedures: None   Follow-Up: At [facility], you and your health needs are our priority.  As part of our continuing mission to provide you with exceptional heart care, we have created designated Provider Care Teams.  These Care Teams include your primary Cardiologist (physician) and Advanced Practice Providers (APPs -  Physician Assistants and Nurse Practitioners) who all work together to provide you with the care you need, when you need it.  We recommend signing up for the patient portal called "MyChart".  Sign up information is provided on this After Visit Summary.  MyChart is used to connect with patients for Virtual Visits (Telemedicine).  Patients are able to view lab/test results, encounter notes, upcoming appointments, etc.  Non-urgent messages can be sent to your provider as well.   To learn more about what you can do with MyChart, go to [email or web address].    Your next appointment:   6 month(s)  Provider:   Shirlee More, MD    Other Instructions 8500 steps per day   [facility]  Tips to measure your blood pressure correctly  To determine whether you have hypertension, a medical professional will take a blood pressure reading. How you prepare for the test, the position of your  arm, and other factors can change a blood pressure reading by 10% or more. That could be enough to hide high blood pressure, start you on a drug you don't really need, or lead your doctor to incorrectly adjust your medications. National and international guidelines offer specific instructions for measuring blood pressure. If a doctor, nurse, or medical assistant isn't doing it right, don't hesitate to ask him or her to get with the guidelines. Here's what you can do to ensure a correct reading:  Don't drink a caffeinated beverage or smoke during the 30 minutes before the test.  Sit quietly for five minutes before the test begins.  During the measurement, sit in a chair with your feet on the floor and your arm supported so your elbow is at about heart level.  The inflatable part of the cuff should completely cover at least 80% of your upper arm, and the cuff should be placed on bare skin, not over a shirt.  Don't talk during the measurement.  Have your blood pressure measured twice, with a brief break in between. If the readings are different by 5 points or more, have it done a third time. There are times to break these rules. If you sometimes feel lightheaded when getting out of bed in the morning or when you stand after sitting, you should have your blood pressure checked while seated and then while standing to see if it falls from one position to the next. Because blood pressure varies throughout the day, your doctor will rarely diagnose hypertension  on the basis of a single reading. Instead, he or she will want to confirm the measurements on at least two occasions, usually within a few weeks of one another. The exception to this rule is if you have a blood pressure reading of 180/110 mm Hg or higher. A result this high usually calls for prompt treatment. It's also a good idea to have your blood pressure measured in both arms at least once, since the reading in one arm (usually the right) may be higher  than that in the left. A 2014 study in The American Journal of Medicine of nearly 3,400 people found average arm- to-arm differences in systolic blood pressure of about 5 points. The higher number should be used to make treatment decisions. In 2017, new guidelines from the [place], the [employer] of Cardiology, and nine other health organizations lowered the diagnosis of high blood pressure to 130/80 mm Hg or higher for all adults. The guidelines also redefined the various blood pressure categories to now include normal, elevated, Stage 1 hypertension, Stage 2 hypertension, and hypertensive crisis (see "Blood pressure categories"). Blood pressure categories  Blood pressure category SYSTOLIC (upper number)  DIASTOLIC (lower number)  Normal Less than 120 mm Hg and Less than 80 mm Hg  Elevated 120-129 mm Hg and Less than 80 mm Hg  High blood pressure: Stage 1 hypertension 130-139 mm Hg or 80-89 mm Hg  High blood pressure: Stage 2 hypertension 140 mm Hg or higher or 90 mm Hg or higher  Hypertensive crisis (consult your doctor immediately) Higher than 180 mm Hg and/or Higher than 120 mm Hg  Source: American Heart Association and American Stroke Association. For more on getting your blood pressure under control, buy Controlling Your Blood Pressure, a Special Health Report from [facility].

## 2023-01-04 NOTE — Addendum Note (Signed)
Addended by: Darius Bump B on: 01/04/2023 09:50 AM   Modules accepted: Orders

## 2023-01-24 ENCOUNTER — Ambulatory Visit (HOSPITAL_BASED_OUTPATIENT_CLINIC_OR_DEPARTMENT_OTHER): Payer: Managed Care, Other (non HMO) | Admitting: Pharmacist Clinician (PhC)/ Clinical Pharmacy Specialist

## 2023-01-24 VITALS — BP 118/75 | HR 66 | Ht 63.5 in | Wt 136.8 lb

## 2023-01-24 DIAGNOSIS — I1A0 Resistant hypertension: Secondary | ICD-10-CM

## 2023-01-24 NOTE — Progress Notes (Unsigned)
Office Visit    Patient Name: Stephanie Evans Date of Encounter: 01/25/2023  Primary Care Provider:  Elenore Paddy, NP Primary Cardiologist:  Skeet Latch MD  Chief Complaint    Hypertension - Advanced hypertension clinic  Past Medical History   CKD Stage 3 - due to longstanding hypertension  Aortic dissection Surgery 08/2022    Allergies  Allergen Reactions   Lisinopril Anaphylaxis    syncope    History of Present Illness    Stephanie Evans is a 37 y.o. female patient who was referred to the Advanced Hypertension Clinic by Dr. Bettina Gavia.  Patient has history of hypertension, being diagnosed at the age of 22.  She was admitted to [facility] last summer for hypertensive emergency.  She notes that home pressures will occasionally spike to as high as 035 systolic, and when it drops to 110-120 range she will feel lightheaded.   She saw Dr. Oval Linsey in early December, with a BP of 172/98, suspected to be herediary and not secondary.  Workup showed no secondary causes.   Colchicine and hydralazine were both switched to twice daily for better compliance.  At last visit she was still having some compliance issues, had been out of nifedipine for several days at that time.  Medications were all changed to 90 day supplies to help with this and her clonidine was switched from tablets to patches.  She did confirm that she is not planning on having any more children.    Today she returns for follow up.  States still seeing some home readings as high as 009 or 381 systolic, but seeing more in normal range.   Last visit BP 137/85 - switched clonidine tabs to patches.  Saw Dr. Bettina Gavia the following week BP 110/74    Blood Pressure Goal:  130/80  Current Medications: chlorthalidone 25 mg qd, clonidine 0.2 mg patch weekly, hydralazine 100 mg bid, nifedipine xl 90 mg qd, spironolactone 25 mg qd, carvedilol 25 mg bid  Adherence Assessment  Do you ever forget to take your medication? [x] Yes  only occasionally [] No  Do you ever skip doses due to side effects? [] Yes [x] No  Do you have trouble affording your medicines? [] Yes [x] No  Are you ever unable to pick up your medication due to transportation difficulties? [] Yes [x] No  Do you ever stop taking your medications because you don't believe they are helping? [] Yes [x] No    Previously tried:   lisinopril - syncope  Family Hx:   father also with hypertension ; sister also has hyperension, but not as labile; 3 kids oldest 62, 107, 56, no more planned  Social Hx:      Tobacco: no, but occasional marijuana  Alcohol: occasionally, may have more than 2 per day  Caffeine:  occassional coffee, avoids soda  Diet:    mix of home and eating out ; has 3 kids to feed - late lunch at fast food to keep kids goin; no pork, low beef; more poultry; lots of vegetables, doesn't snack much  Exercise: started yoga last week, chasing 3 kids; wants to start Cross fit on Monday; tired pilates, thought was intense; does 4 days a week 30 min on treadmill with 12 incline 2.5 speed; then isolated resistance  or class; sauna every days  Home BP readings:     No home readigs today - still seeint 140/-150, highest was 1701, lowest 829; diastolic mostly 93-71  Last visit am average 136/82, PM average 153/96  Accessory Clinical Findings  Lab Results  Component Value Date   CREATININE 1.28 (H) 01/04/2023   BUN 22 (H) 01/04/2023   NA 138 01/04/2023   K 3.5 01/04/2023   CL 99 01/04/2023   CO2 23 01/04/2023   Lab Results  Component Value Date   ALT 12 09/26/2021   AST 14 (L) 09/26/2021   ALKPHOS 44 09/26/2021   BILITOT 1.7 (H) 09/26/2021   No results found for: "HGBA1C"  Screening for Secondary Hypertension:      11/23/2022    9:26 AM  Causes  Drugs/Herbals Screened     - Comments limits sodium intake,  cooks and eats out, one coffee daily  Renovascular HTN Screened  Sleep Apnea Screened     - Comments snores.  no other symptoms   Coarctation of the Aorta Screened     - Comments BP symmetric    Relevant Labs/Studies:    Latest Ref Rng & Units 01/04/2023   10:06 AM 07/20/2022   10:31 AM 04/11/2022    2:07 PM  Basic Labs  Sodium 134 - 144 mmol/L 138  140  138   Potassium 3.5 - 5.2 mmol/L 3.5  3.5  3.1   Creatinine 0.57 - 1.00 mg/dL 1.28  1.52  1.29           Latest Ref Rng & Units 07/20/2022   10:31 AM  Renin/Aldosterone   Aldosterone 0.0 - 30.0 ng/dL 20.9   Renin 0.167 - 5.380 ng/mL/hr 7.362   Aldos/Renin Ratio 0.0 - 30.0 2.8              07/21/2022    8:31 AM  Renovascular   Renal Artery [place] Completed Yes      Home Medications    Current Outpatient Medications  Medication Sig Dispense Refill   carvedilol (COREG) 25 MG tablet Take 1 tablet (25 mg total) by mouth 2 (two) times daily. 180 tablet 3   chlorthalidone (HYGROTON) 25 MG tablet Take 1 tablet (25 mg total) by mouth daily. 90 tablet 3   cloNIDine (CATAPRES - DOSED IN MG/24 HR) 0.2 mg/24hr patch Place 1 patch (0.2 mg total) onto the skin once a week. 12 patch 3   hydrALAZINE (APRESOLINE) 100 MG tablet Take 1 tablet (100 mg total) by mouth every 12 (twelve) hours. 180 tablet 3   MAGNESIUM GLYCINATE PO Take 4 tablets by mouth daily.     NIFEdipine (PROCARDIA XL/NIFEDICAL-XL) 90 MG 24 hr tablet Take 1 tablet (90 mg total) by mouth daily. 90 tablet 3   oxyCODONE (OXY IR/ROXICODONE) 5 MG immediate release tablet Take 5 mg by mouth every 6 (six) hours as needed for pain.     polyethylene glycol (MIRALAX / GLYCOLAX) 17 g packet Take 17 g by mouth in the morning and at bedtime.     spironolactone (ALDACTONE) 25 MG tablet Take 25 mg by mouth daily.     No current facility-administered medications for this visit.     Assessment & Plan      Resistant hypertension Assessment: BP is controlled in office BP 118/75 mmHg Tolerates current medications well, without any side effects Denies SOB, palpitation, chest pain, headaches,or swelling No current  interest in having more children Reiterated the importance of regular exercise and low salt diet   Plan:  Continue taking chlorthalidone, clonidine patch, hydralazine, nifedipine, spironolactone, carvedilol Patient to keep record of BP readings with heart rate and report to [place] at the next visit Patient to follow up with Dr. Oval Linsey in April for  follow up  Labs ordered today:  none   Tommy Medal PharmD CPP [facility]  [address] [place], [place] [address] [phone]

## 2023-01-24 NOTE — Patient Instructions (Signed)
  Take your BP meds as follows:  Continue with your current medications  Check your blood pressure at home daily (if able) and keep record of the readings.  Hypertension "High blood pressure"  Hypertension is often called "The Silent Killer." It rarely causes symptoms until it is extremely  high or has done damage to other organs in the body. For this reason, you should have your  blood pressure checked regularly by your physician. We will check your blood pressure  every time you see a provider at one of our offices.   Your blood pressure reading consists of two numbers. Ideally, blood pressure should be  below 120/80. The first ("top") number is called the systolic pressure. It measures the  pressure in your arteries as your heart beats. The second ("bottom") number is called the diastolic pressure. It measures the pressure in your arteries as the heart relaxes between beats.  The benefits of getting your blood pressure under control are enormous. A 10-point  reduction in systolic blood pressure can reduce your risk of stroke by 27% and heart failure by 28%  Your blood pressure goal is < 130/80  To check your pressure at home you will need to:  1. Sit up in a chair, with feet flat on the floor and back supported. Do not cross your ankles or legs. 2. Rest your left arm so that the cuff is about heart level. If the cuff goes on your upper arm,  then just relax the arm on the table, arm of the chair or your lap. If you have a wrist cuff, we  suggest relaxing your wrist against your chest (think of it as Pledging the Flag with the  wrong arm).  3. Place the cuff snugly around your arm, about 1 inch above the crook of your elbow. The  cords should be inside the groove of your elbow.  4. Sit quietly, with the cuff in place, for about 5 minutes. After that 5 minutes press the power  button to start a reading. 5. Do not talk or move while the reading is taking place.  6. Record your  readings on a sheet of paper. Although most cuffs have a memory, it is often  easier to see a pattern developing when the numbers are all in front of you.  7. You can repeat the reading after 1-3 minutes if it is recommended  Make sure your bladder is empty and you have not had caffeine or tobacco within the last 30 min  Always bring your blood pressure log with you to your appointments. If you have not brought your monitor in to be double checked for accuracy, please bring it to your next appointment.  You can find a list of quality blood pressure cuffs at validatebp.org

## 2023-01-25 ENCOUNTER — Encounter (HOSPITAL_BASED_OUTPATIENT_CLINIC_OR_DEPARTMENT_OTHER): Payer: Self-pay | Admitting: Pharmacist Clinician (PhC)/ Clinical Pharmacy Specialist

## 2023-01-25 ENCOUNTER — Telehealth: Payer: Self-pay

## 2023-01-25 NOTE — Assessment & Plan Note (Signed)
Assessment: BP is controlled in office BP 118/75 mmHg Tolerates current medications well, without any side effects Denies SOB, palpitation, chest pain, headaches,or swelling No current interest in having more children Reiterated the importance of regular exercise and low salt diet   Plan:  Continue taking chlorthalidone, clonidine patch, hydralazine, nifedipine, spironolactone, carvedilol Patient to keep record of BP readings with heart rate and report to [place] at the next visit Patient to follow up with Dr. Oval Linsey in April for follow up  Labs ordered today:  none

## 2023-01-25 NOTE — Telephone Encounter (Signed)
Received a message from Dr. Bettina Gavia for the patient to start taking aspirin 81 mg daily. Attempted to call the patient to inform her of Dr. Joya Gaskins recommendation and she did not answer the phone her voice mail was not set-up on her phone to leave a voice mail.

## 2023-02-06 ENCOUNTER — Other Ambulatory Visit: Payer: Self-pay

## 2023-02-06 ENCOUNTER — Telehealth: Payer: Self-pay

## 2023-02-06 ENCOUNTER — Telehealth: Payer: Self-pay | Admitting: Cardiovascular Disease

## 2023-02-06 MED ORDER — ASPIRIN 81 MG PO TBEC: 81.0000 mg | DELAYED_RELEASE_TABLET | Freq: Every day | ORAL | 3 refills | Status: DC

## 2023-02-06 NOTE — Telephone Encounter (Signed)
Tried to call patient, no answer or voicemail  Papers need to be signed by Dr Oval Linsey and will fax

## 2023-02-06 NOTE — Telephone Encounter (Signed)
-----   Message from Richardo Priest, MD sent at 01/25/2023  7:25 AM EST ----- Regarding: RE: aspirin Yes take 81 mg aspirin day ----- Message ----- From: Rockne Menghini, RPH-CPP Sent: 01/25/2023   7:10 AM EST To: Richardo Priest, MD Subject: aspirin                                        Pt was seen in HTN clinic yesterday.  States she has been off ASA 81 mg for about 2 months.  Wants to know if she should continue.  Please have your nurse respond to patient.   Thanks,  Erasmo Downer

## 2023-02-06 NOTE — Telephone Encounter (Signed)
Pt called and said that she faxed in her paperwork for [facility] on 01/25/23 and she wanted to make sure that Dr. Oval Linsey has received it.

## 2023-02-06 NOTE — Telephone Encounter (Signed)
Called the patient and she stated that she had been on Aspirin 81 mg daily for a while but her prescription had run out. I explained that Dr. Bettina Gavia wanted her to be on Aspirin 81 mg daily and that I would renew her prescription. Patient was agreeable with this plan and had no further questions at this time.

## 2023-02-06 NOTE — Telephone Encounter (Signed)
Patient is following up. She states the paperwork is due today and she is hopeful for feedback as soon as possible.

## 2023-02-07 NOTE — Telephone Encounter (Signed)
Completed, faxed, confirmation received and mailed copy  Patient aware

## 2023-02-08 ENCOUNTER — Encounter (HOSPITAL_BASED_OUTPATIENT_CLINIC_OR_DEPARTMENT_OTHER): Payer: Self-pay | Admitting: Cardiovascular Disease

## 2023-02-08 ENCOUNTER — Encounter: Payer: Self-pay | Admitting: Cardiology

## 2023-02-18 NOTE — Progress Notes (Signed)
Office Visit    Patient Name: Stephanie Evans Date of Encounter: 02/20/2023  Primary Care Provider:  Elenore Paddy, NP Primary Cardiologist:  Skeet Latch MD  Chief Complaint    Hypertension - Advanced hypertension clinic  Past Medical History   CKD Stage 3 - due to longstanding hypertension  Aortic dissection Surgery 08/2022    Allergies  Allergen Reactions   Lisinopril Anaphylaxis    syncope    History of Present Illness    Stephanie Evans is a 37 y.o. female patient who was referred to the Advanced Hypertension Clinic by Dr. Bettina Gavia.  Patient has history of hypertension, being diagnosed at the age of 78.  She was admitted to [facility] last summer for hypertensive emergency.  She notes that home pressures will occasionally spike to as high as 123456 systolic, and when it drops to 110-120 range she will feel lightheaded.   She saw Dr. Oval Linsey in early December, with a BP of 172/98, suspected to be herediary and not secondary.  Workup showed no secondary causes.   Colchicine and hydralazine were both switched to twice daily for better compliance.  At last visit she was still having some compliance issues, had been out of nifedipine for several days at that time.  Medications were all changed to 90 day supplies to help with this and her clonidine was switched from tablets to patches.  She did confirm that she is not planning on having any more children.  At her last visit, BP was well controlled 118/75 and she was encouraged to keep with the current routine.    Today she returns for follow up.  She has been doing well, although most home readings are still above normal.  She admits to occasional dizzy spells, believes they occur when her BP drops below 120 and she is being active.  Otherwise has no concerns.    Blood Pressure Goal:  130/80  Current Medications: chlorthalidone 25 mg qd am, clonidine 0.2 mg patch weekly, hydralazine 100 mg bid, nifedipine xl 90 mg qd pm,  spironolactone 25 mg qd am, carvedilol 25 mg bid  Adherence Assessment  Do you ever forget to take your medication? '[x]'$ Yes only occasionally '[]'$ No  Do you ever skip doses due to side effects? '[]'$ Yes '[x]'$ No  Do you have trouble affording your medicines? '[]'$ Yes '[x]'$ No  Are you ever unable to pick up your medication due to transportation difficulties? '[]'$ Yes '[x]'$ No  Do you ever stop taking your medications because you don't believe they are helping? '[]'$ Yes '[x]'$ No    Previously tried:   lisinopril - syncope  Family Hx:   father also with hypertension ; sister also has hyperension, but not as labile; 3 kids oldest 25, 77, 81, no more planned  Social Hx:      Tobacco: no, but occasional marijuana  Alcohol: occasionally, may have more than 2 per day  Caffeine:  occassional coffee, avoids soda  Diet:    mix of home and eating out ; has 3 kids to feed - late lunch at fast food to keep kids goin; no pork, low beef; more poultry; lots of vegetables, doesn't snack much  Exercise: yoga and pilates; chasing 3 kids; does 4 days a week 30 min on treadmill,  then isolated resistance  or class; sauna every days  Home BP readings:     Home readings average 141/85 in the mornings and 140/80 in the evenings.  (Overall range 122-188/66-103)  Accessory Clinical Findings    Lab Results  Component Value Date   CREATININE 1.28 (H) 01/04/2023   BUN 22 (H) 01/04/2023   NA 138 01/04/2023   K 3.5 01/04/2023   CL 99 01/04/2023   CO2 23 01/04/2023   Lab Results  Component Value Date   ALT 12 09/26/2021   AST 14 (L) 09/26/2021   ALKPHOS 44 09/26/2021   BILITOT 1.7 (H) 09/26/2021   No results found for: "HGBA1C"  Screening for Secondary Hypertension:      11/23/2022    9:26 AM  Causes  Drugs/Herbals Screened     - Comments limits sodium intake,  cooks and eats out, one coffee daily  Renovascular HTN Screened  Sleep Apnea Screened     - Comments snores.  no other symptoms  Coarctation of the Aorta  Screened     - Comments BP symmetric    Relevant Labs/Studies:    Latest Ref Rng & Units 01/04/2023   10:06 AM 07/20/2022   10:31 AM 04/11/2022    2:07 PM  Basic Labs  Sodium 134 - 144 mmol/L 138  140  138   Potassium 3.5 - 5.2 mmol/L 3.5  3.5  3.1   Creatinine 0.57 - 1.00 mg/dL 1.28  1.52  1.29           Latest Ref Rng & Units 07/20/2022   10:31 AM  Renin/Aldosterone   Aldosterone 0.0 - 30.0 ng/dL 20.9   Renin 0.167 - 5.380 ng/mL/hr 7.362   Aldos/Renin Ratio 0.0 - 30.0 2.8              07/21/2022    8:31 AM  Renovascular   Renal Artery [place] Completed Yes      Home Medications    Current Outpatient Medications  Medication Sig Dispense Refill   aspirin EC 81 MG tablet Take 1 tablet (81 mg total) by mouth daily. Swallow whole. 90 tablet 3   carvedilol (COREG) 25 MG tablet Take 1 tablet (25 mg total) by mouth 2 (two) times daily. 180 tablet 3   chlorthalidone (HYGROTON) 25 MG tablet Take 1 tablet (25 mg total) by mouth daily. 90 tablet 3   cloNIDine (CATAPRES - DOSED IN MG/24 HR) 0.2 mg/24hr patch Place 1 patch (0.2 mg total) onto the skin once a week. 12 patch 3   hydrALAZINE (APRESOLINE) 100 MG tablet Take 1 tablet (100 mg total) by mouth every 12 (twelve) hours. 180 tablet 3   MAGNESIUM GLYCINATE PO Take 4 tablets by mouth daily.     NIFEdipine (PROCARDIA XL/NIFEDICAL-XL) 90 MG 24 hr tablet Take 1 tablet (90 mg total) by mouth daily. 90 tablet 3   oxyCODONE (OXY IR/ROXICODONE) 5 MG immediate release tablet Take 5 mg by mouth every 6 (six) hours as needed for pain.     polyethylene glycol (MIRALAX / GLYCOLAX) 17 g packet Take 17 g by mouth in the morning and at bedtime.     spironolactone (ALDACTONE) 25 MG tablet Take 25 mg by mouth daily.     No current facility-administered medications for this visit.     Assessment & Plan      Resistant hypertension Assessment: BP is controlled in office BP 115/75 mmHg;  No dizziness in office - notes it usually occurs when she is  very active Tolerates current medications well without any side effects Denies SOB, palpitation, chest pain, headaches,or swelling Praised her current exercise plan and low salt diet   Plan:  Continue taking chlorthalidone 25 mg qd am, clonidine 0.2 mg patch weekly,  hydralazine 100 mg bid, nifedipine xl 90 mg qd pm, spironolactone 25 mg qd am, carvedilol 25 mg bid Patient to keep record of BP readings with heart rate and report to [place] at the next visit Patient to follow up with Dr. Oval Linsey in 1 month for follow up  Labs ordered today:  none   Tommy Medal PharmD CPP [place]  [address] [place], [place] [address] [phone]

## 2023-02-20 ENCOUNTER — Encounter (HOSPITAL_BASED_OUTPATIENT_CLINIC_OR_DEPARTMENT_OTHER): Payer: Self-pay | Admitting: Pharmacist Clinician (PhC)/ Clinical Pharmacy Specialist

## 2023-02-20 ENCOUNTER — Ambulatory Visit (HOSPITAL_BASED_OUTPATIENT_CLINIC_OR_DEPARTMENT_OTHER): Payer: Managed Care, Other (non HMO) | Admitting: Pharmacist Clinician (PhC)/ Clinical Pharmacy Specialist

## 2023-02-20 DIAGNOSIS — I1A0 Resistant hypertension: Secondary | ICD-10-CM | POA: Diagnosis not present

## 2023-02-20 MED ORDER — ASPIRIN 81 MG PO TBEC: 81.0000 mg | DELAYED_RELEASE_TABLET | Freq: Every day | ORAL | 3 refills | Status: AC

## 2023-02-20 NOTE — Assessment & Plan Note (Addendum)
Assessment: BP is controlled in office BP 115/75 mmHg;  No dizziness in office - notes it usually occurs when she is very active Tolerates current medications well without any side effects Denies SOB, palpitation, chest pain, headaches,or swelling Praised her current exercise plan and low salt diet   Plan:  Continue taking chlorthalidone 25 mg qd am, clonidine 0.2 mg patch weekly, hydralazine 100 mg bid, nifedipine xl 90 mg qd pm, spironolactone 25 mg qd am, carvedilol 25 mg bid Patient to keep record of BP readings with heart rate and report to [place] at the next visit Patient to follow up with Dr. Oval Linsey in 1 month for follow up  Labs ordered today:  none

## 2023-02-20 NOTE — Patient Instructions (Addendum)
Follow up appointment: Dr. Oval Linsey on April 25 at 10:15 am  Take your BP meds as follows:  No change in your medications today  Check your blood pressure at home daily (if able) and keep record of the readings.  Hypertension "High blood pressure"  Hypertension is often called "The Silent Killer." It rarely causes symptoms until it is extremely  high or has done damage to other organs in the body. For this reason, you should have your  blood pressure checked regularly by your physician. We will check your blood pressure  every time you see a provider at one of our offices.   Your blood pressure reading consists of two numbers. Ideally, blood pressure should be  below 120/80. The first ("top") number is called the systolic pressure. It measures the  pressure in your arteries as your heart beats. The second ("bottom") number is called the diastolic pressure. It measures the pressure in your arteries as the heart relaxes between beats.  The benefits of getting your blood pressure under control are enormous. A 10-point  reduction in systolic blood pressure can reduce your risk of stroke by 27% and heart failure by 28%  Your blood pressure goal is < 130/80  To check your pressure at home you will need to:  1. Sit up in a chair, with feet flat on the floor and back supported. Do not cross your ankles or legs. 2. Rest your left arm so that the cuff is about heart level. If the cuff goes on your upper arm,  then just relax the arm on the table, arm of the chair or your lap. If you have a wrist cuff, we  suggest relaxing your wrist against your chest (think of it as Pledging the Flag with the  wrong arm).  3. Place the cuff snugly around your arm, about 1 inch above the crook of your elbow. The  cords should be inside the groove of your elbow.  4. Sit quietly, with the cuff in place, for about 5 minutes. After that 5 minutes press the power  button to start a reading. 5. Do not talk or  move while the reading is taking place.  6. Record your readings on a sheet of paper. Although most cuffs have a memory, it is often  easier to see a pattern developing when the numbers are all in front of you.  7. You can repeat the reading after 1-3 minutes if it is recommended  Make sure your bladder is empty and you have not had caffeine or tobacco within the last 30 min  Always bring your blood pressure log with you to your appointments. If you have not brought your monitor in to be double checked for accuracy, please bring it to your next appointment.  You can find a list of quality blood pressure cuffs at validatebp.org

## 2023-02-27 NOTE — Telephone Encounter (Signed)
Call to patient's pharmacy, prescription for low dose ASA was not received from February 20, 2023.  Pt requested prescription sent to pharmacy, per pharmacy insurance has covered aspirin in the past. Prescription sent to [facility] on [place]

## 2023-04-11 NOTE — Progress Notes (Signed)
Advanced Hypertension Clinic Follow-up:    Date:  04/13/2023   ID:  Stephanie Evans, DOB 1986-12-19, MRN [id number]  PCP:  Julianne Handler, NP  Cardiologist:  None  Nephrologist:  Referring MD: Julianne Handler, NP   CC: Hypertension  History of Present Illness:    Stephanie Evans is a 37 y.o. female with a hx of hypertension, Type B aortic dissection s/p TEVAR, and CKD III, here for follow-up. She was initially seen 11/23/2022 in the Advanced Hypertension Clinic. She initially saw Dr. Dulce Sellar 07/2022 after an admission for hypertensive urgency. She was not on any medications prior to this event and was started on amlodipine. She first developed hypertension at age 31. She has a history of anaphylaxis with an ACE inhibitor. She was working on lifestyle and had lost 52 lbs, but blood pressure remained in the 200s/130s. Labs were negative for hyperaldosteronism. Metanephrines were normal. Renal artery dopplers were normal 07/2022. She was admitted to the hospital 09/2022 with a type B aortic dissection. She underwent TEVAR 09/20/22. She followed up with Dr. Dulce Sellar later that month and her home blood pressures were in the 110s-130s/70s. At the time, she was on carvedilol, clonidine, hydralazine, nifedipine, and spironolactone. She had an Echo 08/2022 that revealed LVEF 65-70% with normal diastolic function.   At her 11/2022 visit, her BP was 172/98 in the setting of forgetting to take her antihypertensives prior to leaving the house. She complained of some lightheadedness and dizziness; she was not quite used to her blood pressures being in the 110s to 120s. She has a family history of very early onset hypertension and I suspected that her etiology is genetic. She was encouraged to gradually increase her exercise. We discussed the importance of limiting all sodium intake with a goal of less than 2300 mg a day. She was struggling with symptoms from clonidine, so it was switched to 0.2 mg twice daily with a  goal of trying to wean it off if possible. Continued carvedilol, nifedipine, and spironolactone. We transitioned hydralazine to 100 mg twice daily instead of 25 mg every 6 hours.  She was not felt to be a good candidate for renal denervation due to her history of dissection down to the level of the SMA. She saw our pharmacist 12/2022 and her clonidine was switched from tablets to patches. In 01/2023 she was still seeing some home readings as high as 140-150 systolic, but more frequently in the normal range. At her follow-up 02/2023 she was mostly seeing home readings above normal. She complained of occasional dizzy spells which she thought were related to BP below 120 and being active.  Today, she presents a BP log showing mostly 120s-140s, only once in the 160s. On the days she orders out, she will notice a subsequent spike in her blood pressure. Often with breads and pasta. She reports that her clonidine was switched to the patches last month. She had continued taking the tablets. For a time she was exercising frequently with pilates and cardio workouts. She used the stairmaster for 30 minutes. Lately she hasn't been exercising as much since she returned to work last month. She works at [employer] and sometimes performs heavy lifting such as packs of bottled water. She has been trying to work on dietary changes, such as healthier pasta alternatives. She has a diet plan that she will try to follow. She denies any palpitations, chest pain, shortness of breath, or peripheral edema. No lightheadedness, headaches, syncope, orthopnea, or PND.  Previous  antihypertensives: Lisinopril - syncope  Past Medical History:  Diagnosis Date   Hypertension    Resistant hypertension 09/18/2022    Past Surgical History:  Procedure Laterality Date   NO PAST SURGERIES      Current Medications: Current Meds  Medication Sig   aspirin EC 81 MG tablet Take 1 tablet (81 mg total) by mouth daily. Swallow whole.   carvedilol  (COREG) 25 MG tablet Take 1 tablet (25 mg total) by mouth 2 (two) times daily.   chlorthalidone (HYGROTON) 25 MG tablet Take 1 tablet (25 mg total) by mouth daily.   cloNIDine (CATAPRES) 0.2 MG tablet Take 0.2 mg by mouth 2 (two) times daily.   hydrALAZINE (APRESOLINE) 100 MG tablet Take 1 tablet (100 mg total) by mouth every 12 (twelve) hours.   MAGNESIUM GLYCINATE PO Take 4 tablets by mouth daily.   medroxyPROGESTERone Acetate 150 MG/ML SUSY Inject 1 mL into the muscle every 3 (three) months.   NIFEdipine (PROCARDIA XL/NIFEDICAL-XL) 90 MG 24 hr tablet Take 1 tablet (90 mg total) by mouth daily.   oxyCODONE (OXY IR/ROXICODONE) 5 MG immediate release tablet Take 5 mg by mouth every 6 (six) hours as needed for pain.   polyethylene glycol (MIRALAX / GLYCOLAX) 17 g packet Take 17 g by mouth in the morning and at bedtime.   spironolactone (ALDACTONE) 25 MG tablet Take 25 mg by mouth daily.   [DISCONTINUED] cloNIDine (CATAPRES - DOSED IN MG/24 HR) 0.2 mg/24hr patch Place 1 patch (0.2 mg total) onto the skin once a week.     Allergies:   Lisinopril   Social History   Socioeconomic History   Marital status: Significant Other    Spouse name: Not on file   Number of children: Not on file   Years of education: Not on file   Highest education level: Not on file  Occupational History   Not on file  Tobacco Use   Smoking status: Never   Smokeless tobacco: Never  Vaping Use   Vaping Use: Never used  Substance and Sexual Activity   Alcohol use: Yes    Comment: Rare   Drug use: Yes    Types: Marijuana    Comment: Daily   Sexual activity: Not on file  Other Topics Concern   Not on file  Social History Narrative   Not on file   Social Determinants of Health   Financial Resource Strain: Not on file  Food Insecurity: No Food Insecurity (11/23/2022)   Hunger Vital Sign    Worried About Running Out of Food in the Last Year: Never true    Ran Out of Food in the Last Year: Never true   Transportation Needs: No Transportation Needs (11/23/2022)   PRAPARE - Administrator, Civil Service (Medical): No    Lack of Transportation (Non-Medical): No  Physical Activity: Inactive (11/23/2022)   Exercise Vital Sign    Days of Exercise per Week: 0 days    Minutes of Exercise per Session: 0 min  Stress: Not on file  Social Connections: Not on file     Family History: The patient's family history includes Breast cancer in her mother; Diabetes in her father and mother; Hypertension in her father, mother, and sister; Sleep apnea in her father.  ROS:   Please see the history of present illness.    All other systems reviewed and are negative.  EKGs/Labs/Other Studies Reviewed:    CTA Chest/Abdomen/Pelvis  10/27/2022 ([facility]): CONCLUSION:  Postprocedural  changes of thoracic endograft placement with improved caliber of the true lumen and decreased size but persistent flow within the false lumen of the patient's prior type B aortic aneurysm. Majority of this flow appears retrograde via the distal thoracic/proximal suprarenal abdominal aorta intraluminal defect, however, there is a small persistent intraluminal defect contributing to flow just distal to the left subclavian branch as further described above.   TTE  09/16/2022  ([facility]): SUMMARY  The left ventricular size is normal with normal left ventricular wall  thickness.  Left ventricular systolic function is normal.  LV ejection fraction = 65-70%.  The right ventricle is normal in size and function.  There is no significant valvular stenosis or regurgitation.   Linear echodensity seen in abdominal aorta consistent with dissection  flap.   The IVC is normal in size with an inspiratory collapse of greater then  50%, suggesting normal right atrial pressure.  There is no pericardial effusion.  There is no comparison study available.   Bilateral Renal Artery Dopplers  07/21/2022: Summary:  Largest  Aortic Diameter: 2.3 cm    Renal:    Right: Normal size right kidney. Normal right Resisitive Index. No         evidence of right renal artery stenosis.  Left:  Normal size of left kidney. Normal left Resistive Index. No         evidence of left renal artery stenosis.  Mesenteric:  Normal Superior Mesenteric artery and Celiac artery findings.   CTA Chest  09/26/2021: IMPRESSION: No pulmonary embolism. No radiographic evidence of acute cardiopulmonary disease.  EKG:  EKG is personally reviewed. 04/13/2023:  EKG was not ordered. 11/23/2022: EKG was not ordered.   Recent Labs: 01/04/2023: BUN 22; Creatinine, Ser 1.28; Potassium 3.5; Sodium 138   Recent Lipid Panel No results found for: "CHOL", "TRIG", "HDL", "CHOLHDL", "VLDL", "LDLCALC", "LDLDIRECT"  Physical Exam:    VS:  BP 106/66 (BP Location: Left Arm, Patient Position: Sitting, Cuff Size: Normal)   Pulse 65   Ht 5' 3.5" (1.613 m)   Wt 138 lb 14.4 oz (63 kg)   SpO2 97%   BMI 24.22 kg/m  , BMI Body mass index is 24.22 kg/m. GENERAL:  Well appearing HEENT: Pupils equal round and reactive, fundi not visualized, oral mucosa unremarkable NECK:  No jugular venous distention, waveform within normal limits, carotid upstroke brisk and symmetric, no bruits, no thyromegaly LUNGS:  Clear to auscultation bilaterally HEART:  RRR.  PMI not displaced or sustained,S1 and S2 within normal limits, no S3, no S4, no clicks, no rubs, no murmurs ABD:  Flat, positive bowel sounds normal in frequency in pitch, no bruits, no rebound, no guarding, no midline pulsatile mass, no hepatomegaly, no splenomegaly EXT:  2 plus pulses throughout, no edema, no cyanosis no clubbing SKIN:  No rashes no nodules NEURO:  Cranial nerves II through XII grossly intact, motor grossly intact throughout PSYCH:  Cognitively intact, oriented to person place and time   ASSESSMENT/PLAN:    Resistant hypertension Overall her BP has been better controlled.  It is  averaging in the 120s-130s at home.  BP was low today and she has some low BP occasionally at home.  Continue her current regimen of chlorthalidone, carvedilol, clonidine, hydralazine, nifedipine, and spironolactone.  She is doing a good job of working on her diet and exercise.  She had a lot of questions about her overall risk and diet.  We discussed the importance of limiting sodium intake.  She  does not have any fasting lipids on file.  She will come back for fasting lipids and a CMP.  She will also check an A1c to better understand her overall cardiovascular risk.  Dissecting aneurysm of aorta (HCC) She had a type B dissection and underwent TEVAR on 09/2022.  She continues to follow-up with vascular surgery at [facility].  Blood pressures are now much better controlled on the above regimen.  No changes at this time.  She is on a beta-blocker.   Screening for Secondary Hypertension:     11/23/2022    9:26 AM  Causes  Drugs/Herbals Screened     - Comments limits sodium intake,  cooks and eats out, one coffee daily  Renovascular HTN Screened  Sleep Apnea Screened     - Comments snores.  no other symptoms  Coarctation of the Aorta Screened     - Comments BP symmetric    Relevant Labs/Studies:    Latest Ref Rng & Units 01/04/2023   10:06 AM 07/20/2022   10:31 AM 04/11/2022    2:07 PM  Basic Labs  Sodium 134 - 144 mmol/L 138  140  138   Potassium 3.5 - 5.2 mmol/L 3.5  3.5  3.1   Creatinine 0.57 - 1.00 mg/dL 1.61  0.96  0.45           Latest Ref Rng & Units 07/20/2022   10:31 AM  Renin/Aldosterone   Aldosterone 0.0 - 30.0 ng/dL 40.9   Renin 8.119 - 1.478 ng/mL/hr 7.362   Aldos/Renin Ratio 0.0 - 30.0 2.8              07/21/2022    8:31 AM  Renovascular   Renal Artery [place] Completed Yes     Disposition:    FU with Lisaanne Lawrie C. Duke Salvia, MD, [facility] in 6 months.  Medication Adjustments/Labs and Tests Ordered: Current medicines are reviewed at length with the  patient today.  Concerns regarding medicines are outlined above.   Orders Placed This Encounter  Procedures   CBC with Differential/Platelet   Lipid panel   Comprehensive metabolic panel   HgB A1c   No orders of the defined types were placed in this encounter.  I,Mathew Stumpf,acting as a Neurosurgeon for Chilton Si, MD.,have documented all relevant documentation on the behalf of Chilton Si, MD,as directed by  Chilton Si, MD while in the presence of Chilton Si, MD.  I, Niasha Devins C. Duke Salvia, MD have reviewed all documentation for this visit.  The documentation of the exam, diagnosis, procedures, and orders on 04/13/2023 are all accurate and complete.  Signed, Chilton Si, MD  04/13/2023 12:41 PM    [facility]

## 2023-04-13 ENCOUNTER — Ambulatory Visit (INDEPENDENT_AMBULATORY_CARE_PROVIDER_SITE_OTHER): Payer: Managed Care, Other (non HMO) | Admitting: Cardiovascular Disease

## 2023-04-13 ENCOUNTER — Encounter (HOSPITAL_BASED_OUTPATIENT_CLINIC_OR_DEPARTMENT_OTHER): Payer: Self-pay | Admitting: Cardiovascular Disease

## 2023-04-13 VITALS — BP 106/66 | HR 65 | Ht 63.5 in | Wt 138.9 lb

## 2023-04-13 DIAGNOSIS — I1A0 Resistant hypertension: Secondary | ICD-10-CM | POA: Diagnosis not present

## 2023-04-13 DIAGNOSIS — Z006 Encounter for examination for normal comparison and control in clinical research program: Secondary | ICD-10-CM

## 2023-04-13 DIAGNOSIS — R7309 Other abnormal glucose: Secondary | ICD-10-CM

## 2023-04-13 DIAGNOSIS — Z13220 Encounter for screening for lipoid disorders: Secondary | ICD-10-CM

## 2023-04-13 NOTE — Patient Instructions (Addendum)
Medication Instructions:  Your physician recommends that you continue on your current medications as directed. Please refer to the Current Medication list given to you today.    Labwork: FATING LP/CEMT/CBC/A1C SOOON   Testing/Procedures: NONE  Follow-Up: 10/11/2023 10:30 AM WITH DR Avenel    If you need a refill on your cardiac medications before your next appointment, please call your pharmacy.

## 2023-04-13 NOTE — Research (Signed)
I saw pt today after Dr. Parmele's follow up visit. Pt is in Dr. [place]'s Virtual Care HTN Study. Pt filled out research survey. Pt was enrolled in Group 1. Pt has successfully reached her blood pressure goal and completed the Virtual Care HTN Study. 

## 2023-04-13 NOTE — Assessment & Plan Note (Addendum)
Overall her BP has been better controlled.  It is averaging in the 120s-130s at home.  BP was low today and she has some low BP occasionally at home.  Continue her current regimen of chlorthalidone, carvedilol, clonidine, hydralazine, nifedipine, and spironolactone.  She is doing a good job of working on her diet and exercise.  She had a lot of questions about her overall risk and diet.  We discussed the importance of limiting sodium intake.  She does not have any fasting lipids on file.  She will come back for fasting lipids and a CMP.  She will also check an A1c to better understand her overall cardiovascular risk.

## 2023-04-13 NOTE — Assessment & Plan Note (Signed)
She had a type B dissection and underwent TEVAR on 09/2022.  She continues to follow-up with vascular surgery at [facility].  Blood pressures are now much better controlled on the above regimen.  No changes at this time.  She is on a beta-blocker.

## 2023-08-30 ENCOUNTER — Other Ambulatory Visit (HOSPITAL_BASED_OUTPATIENT_CLINIC_OR_DEPARTMENT_OTHER): Payer: Self-pay | Admitting: Cardiovascular Disease

## 2023-10-11 ENCOUNTER — Ambulatory Visit (HOSPITAL_BASED_OUTPATIENT_CLINIC_OR_DEPARTMENT_OTHER): Payer: Managed Care, Other (non HMO) | Admitting: Cardiovascular Disease

## 2023-10-11 ENCOUNTER — Encounter (HOSPITAL_BASED_OUTPATIENT_CLINIC_OR_DEPARTMENT_OTHER): Payer: Self-pay | Admitting: Cardiovascular Disease

## 2023-10-11 VITALS — BP 127/82 | HR 65 | Ht 63.5 in | Wt 141.6 lb

## 2023-10-11 DIAGNOSIS — I1A0 Resistant hypertension: Secondary | ICD-10-CM | POA: Diagnosis not present

## 2023-10-11 MED ORDER — CLONIDINE HCL 0.2 MG PO TABS: ORAL_TABLET | ORAL | 1 refills | Status: DC

## 2023-10-11 NOTE — Progress Notes (Deleted)
Advanced Hypertension Clinic Follow-up:    Date:  10/11/2023   ID:  Stephanie Evans, DOB [birth year], MRN [id number]  PCP:  Julianne Handler, NP  Cardiologist:  None  Nephrologist:  Referring MD: Julianne Handler, NP   CC: Hypertension  History of Present Illness:    [name] is a 37 y.o. female with a hx of hypertension, Type B aortic dissection s/p TEVAR, and CKD III, here for follow-up. She was initially seen 11/23/2022 in the [facility]pertension Clinic. She initially saw Dr. Dulce Sellar 07/2022 after an admission for hypertensive urgency. She was not on any medications prior to this event and was started on amlodipine. She first developed hypertension at age 18. She has a history of anaphylaxis with an ACE inhibitor. She was working on lifestyle and had lost 52 lbs, but blood pressure remained in the 200s/130s. Labs were negative for hyperaldosteronism. Metanephrines were normal. Renal artery dopplers were normal 07/2022. She was admitted to the hospital 09/2022 with a type B aortic dissection. She underwent TEVAR 09/20/22. She followed up with Dr. Dulce Sellar later that month and her home blood pressures were in the 110s-130s/70s. At the time, she was on carvedilol, clonidine, hydralazine, nifedipine, and spironolactone. She had an Echo 08/2022 that revealed LVEF 65-70% with normal diastolic function.   At her 11/2022 visit, her BP was 172/98 in the setting of forgetting to take her antihypertensives prior to leaving the house. She complained of some lightheadedness and dizziness; she was not quite used to her blood pressures being in the 110s to 120s. She has a family history of very early onset hypertension and I suspected that her etiology is genetic. She was encouraged to gradually increase her exercise. We discussed the importance of limiting all sodium intake with a goal of less than 2300 mg a day. She was struggling with symptoms from clonidine, so it was switched to 0.2 mg twice daily with a  goal of trying to wean it off if possible. Continued carvedilol, nifedipine, and spironolactone. We transitioned hydralazine to 100 mg twice daily instead of 25 mg every 6 hours.  She was not felt to be a good candidate for renal denervation due to her history of dissection down to the level of the SMA. She saw our pharmacist 12/2022 and her clonidine was switched from tablets to patches. In 01/2023 she was still seeing some home readings as high as 140-150 systolic, but more frequently in the normal range. At her follow-up 02/2023 she was mostly seeing home readings above normal. She complained of occasional dizzy spells which she thought were related to BP below 120 and being active.  At her appointment 03/2023 she was feeling well and blood pressures were mostly in the 120s to 130s.  She had occasional spikes in her blood pressure.  Blood pressure was 106/66 in the office.  She had a repeat CT scan at [facility] to monitor her aorta repair.  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  She also had postsurgical changes in the thoracic aorta endograft with decreased but persistent flow within the false lumen via retrograde flow.  They recommended continued follow-up with repeat imaging in 1 year.  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 Swallow whole.   carvedilol (COREG) 25 MG tablet Take 1 tablet (25 mg total) by mouth 2 (two) times daily.   chlorthalidone (HYGROTON) 25 MG tablet TAKE 1 TABLET (25 MG TOTAL) BY MOUTH DAILY.   cloNIDine (CATAPRES - DOSED IN MG/24 HR) 0.2 mg/24hr patch Place 0.2 mg onto the skin once a week.    hydrALAZINE (APRESOLINE) 100 MG tablet TAKE 1 TABLET BY MOUTH EVERY 12 HOURS   MAGNESIUM GLYCINATE PO Take 4 tablets by mouth daily.   medroxyPROGESTERone Acetate 150 MG/ML SUSY Inject 1 mL into the muscle every 3 (three) months.   NIFEdipine (PROCARDIA XL/NIFEDICAL-XL) 90 MG 24 hr tablet Take 1 tablet (90 mg total) by mouth daily.   oxyCODONE (OXY IR/ROXICODONE) 5 MG immediate release tablet Take 5 mg by mouth every 6 (six) hours as needed for pain.   polyethylene glycol (MIRALAX / GLYCOLAX) 17 g packet Take 17 g by mouth in the morning and at bedtime.   spironolactone (ALDACTONE) 25 MG tablet Take 25 mg by mouth daily.   [DISCONTINUED] cloNIDine (CATAPRES) 0.2 MG tablet Take 0.2 mg by mouth 2 (two) times daily.     Allergies:   Lisinopril   Social History   Socioeconomic History   Marital status: Significant Other    Spouse name: Not on file   Number of children: Not on file   Years of education: Not on file   Highest education level: Not on file  Occupational History   Not on file  Tobacco Use   Smoking status: Never   Smokeless tobacco: Never  Vaping Use   Vaping status: Never Used  Substance and Sexual Activity   Alcohol use: Yes    Comment: Rare   Drug use: Yes    Types: Marijuana    Comment: Daily   Sexual activity: Not on file  Other Topics Concern   Not on file  Social History Narrative   Not on file   Social Determinants of Health   Financial Resource Strain: Not on file  Food Insecurity: No Food Insecurity (11/23/2022)   Hunger Vital Sign    Worried About Running Out of Food in the Last Year: Never true    Ran Out of Food in the Last Year: Never true  Transportation Needs: No Transportation Needs (11/23/2022)   PRAPARE - Administrator, Civil Service (Medical): No    Lack of Transportation (Non-Medical): No  Physical Activity: Inactive (11/23/2022)   Exercise Vital Sign    Days of Exercise per Week: 0 days    Minutes of Exercise per Session: 0  min  Stress: Not on file  Social Connections: Not on file     Family History: The patient's family history includes Breast cancer in her mother; Diabetes in her father and mother; Hypertension in her father, mother, and sister; Sleep apnea in her father.  ROS:   Please see the history of present illness.    All other systems reviewed and are negative.  EKGs/Labs/Other Studies Reviewed:    CTA Chest/Abdomen/Pelvis  10/27/2022 ([facility]): CONCLUSION:  Postprocedural changes of thoracic endograft placement with improved caliber of the true lumen and decreased size but persistent flow within the false lumen of the patient's prior type B aortic aneurysm. Majority of this flow appears retrograde via the distal thoracic/proximal suprarenal abdominal aorta intraluminal defect, however, there is a  small persistent intraluminal defect contributing to flow just distal to the left subclavian branch as further described above.   TTE  09/16/2022  ([facility]): SUMMARY  The left ventricular size is normal with normal left ventricular wall  thickness.  Left ventricular systolic function is normal.  LV ejection fraction = 65-70%.  The right ventricle is normal in size and function.  There is no significant valvular stenosis or regurgitation.   Linear echodensity seen in abdominal aorta consistent with dissection  flap.   The IVC is normal in size with an inspiratory collapse of greater then  50%, suggesting normal right atrial pressure.  There is no pericardial effusion.  There is no comparison study available.   Bilateral Renal Artery Dopplers  07/21/2022: Summary:  Largest Aortic Diameter: 2.3 cm    Renal:    Right: Normal size right kidney. Normal right Resisitive Index. No         evidence of right renal artery stenosis.  Left:  Normal size of left kidney. Normal left Resistive Index. No         evidence of left renal artery stenosis.  Mesenteric:  Normal Superior  Mesenteric artery and Celiac artery findings.   CTA Chest  09/26/2021: IMPRESSION: No pulmonary embolism. No radiographic evidence of acute cardiopulmonary disease.  EKG:  EKG is personally reviewed. 04/13/2023:  EKG was not ordered. 11/23/2022: EKG was not ordered.   Recent Labs: 01/04/2023: BUN 22; Creatinine, Ser 1.28; Potassium 3.5; Sodium 138   Recent Lipid Panel No results found for: "CHOL", "TRIG", "HDL", "CHOLHDL", "VLDL", "LDLCALC", "LDLDIRECT"  Physical Exam:    VS:  BP 127/82 (BP Location: Left Arm)   Pulse 65   Ht 5' 3.5" (1.613 m)   Wt 141 lb 9.6 oz (64.2 kg)   SpO2 98%   BMI 24.69 kg/m  , BMI Body mass index is 24.69 kg/m. GENERAL:  Well appearing HEENT: Pupils equal round and reactive, fundi not visualized, oral mucosa unremarkable NECK:  No jugular venous distention, waveform within normal limits, carotid upstroke brisk and symmetric, no bruits, no thyromegaly LUNGS:  Clear to auscultation bilaterally HEART:  RRR.  PMI not displaced or sustained,S1 and S2 within normal limits, no S3, no S4, no clicks, no rubs, no murmurs ABD:  Flat, positive bowel sounds normal in frequency in pitch, no bruits, no rebound, no guarding, no midline pulsatile mass, no hepatomegaly, no splenomegaly EXT:  2 plus pulses throughout, no edema, no cyanosis no clubbing SKIN:  No rashes no nodules NEURO:  Cranial nerves II through XII grossly intact, motor grossly intact throughout PSYCH:  Cognitively intact, oriented to person place and time   ASSESSMENT/PLAN:    # Hypertension Recent episode of elevated blood pressure likely due to inconsistent use of Clonidine patch. Currently controlled with Clonidine patch 0.2mg  and occasional use of Clonidine pill 0.2mg  for spikes. -Continue Clonidine patch 0.2mg .  Continue carvedilol, chlorthalidone, hydralazine, nifedipine and spironolactone.  -Use Clonidine pill 0.2mg  as needed for blood pressure spikes. -Modify prescription to include extra  pills for emergency use. -Check cholesterol, kidney function, and liver function today.  # Type B aortic dissection:  Regular gym visits and high step count from work. Incorporates weight lifting into routine.  Reminded her no heavy lifting given her aortic dissection repair.  She continues to follow up with Vascular Surgery at [facility].    Follow-up in 6 months or as needed.     Screening for Secondary Hypertension:     11/23/2022  9:26 AM  Causes  Drugs/Herbals Screened     - Comments limits sodium intake,  cooks and eats out, one coffee daily  Renovascular HTN Screened  Sleep Apnea Screened     - Comments snores.  no other symptoms  Coarctation of the Aorta Screened     - Comments BP symmetric    Relevant Labs/Studies:    Latest Ref Rng & Units 01/04/2023   10:06 AM 07/20/2022   10:31 AM 04/11/2022    2:07 PM  Basic Labs  Sodium 134 - 144 mmol/L 138  140  138   Potassium 3.5 - 5.2 mmol/L 3.5  3.5  3.1   Creatinine 0.57 - 1.00 mg/dL 1.61  0.96  0.45           Latest Ref Rng & Units 07/20/2022   10:31 AM  Renin/Aldosterone   Aldosterone 0.0 - 30.0 ng/dL 40.9   Renin 8.119 - 1.478 ng/mL/hr 7.362   Aldos/Renin Ratio 0.0 - 30.0 2.8              07/21/2022    8:31 AM  Renovascular   Renal Artery [place] Completed Yes     Disposition:    FU with Quantina Dershem C. Duke Salvia, MD, [facility] in 6 months.  Medication Adjustments/Labs and Tests Ordered: Current medicines are reviewed at length with the patient today.  Concerns regarding medicines are outlined above.   Orders Placed This Encounter  Procedures   EKG 12-Lead   Meds ordered this encounter  Medications   cloNIDine (CATAPRES) 0.2 MG tablet    Sig: AS NEEDED FOR BLOOD PRESSURE ABOVE 150    Dispense:  30 tablet    Refill:  1    AWARE PATIENT IS ON PATCH   I,Mathew Stumpf,acting as a scribe for Chilton Si, MD.,have documented all relevant documentation on the behalf of Chilton Si, MD,as directed by  Chilton Si, MD while in the presence of Chilton Si, MD.  I, [place] Sigmund C. Duke Salvia, MD have reviewed all documentation for this visit.  The documentation of the exam, diagnosis, procedures, and orders on 10/11/2023 are all accurate and complete.  Signed, Chilton Si, MD  10/11/2023 12:34 PM    [facility]

## 2023-10-11 NOTE — Patient Instructions (Signed)
Medication Instructions:  CONTINUE YOUR CLONIDINE PATCH AND TAKE THE CLONIDINE 0.2 MG TABLETS AS NEEDED FOR BLOOD PRESSURE ABOVE 150   Labwork: LABS TODAY   Testing/Procedures: NONE  Follow-Up: Your physician wants you to follow-up in: 6 MONTHS WITH CAITLIN W NP AND 12 MONTHS WITH DR Duke Salvia  You will receive a reminder letter in the mail two months in advance. If you don't receive a letter, please call our office to schedule the follow-up appointment.  Any Other Special Instructions Will Be Listed Below (If Applicable).     If you need a refill on your cardiac medications before your next appointment, please call your pharmacy.

## 2023-10-11 NOTE — Progress Notes (Signed)
Advanced Hypertension Clinic Follow-up:    Date:  10/11/2023   ID:  Stephanie Evans, DOB 07/13/1986, MRN [id number]  PCP:  Julianne Handler, NP  Cardiologist:  None  Nephrologist:  Referring MD: Julianne Handler, NP   CC: Hypertension  History of Present Illness:    Stephanie Evans is a 37 y.o. female with a hx of hypertension, Type B aortic dissection s/p TEVAR, and CKD III, here for follow-up. She was initially seen 11/23/2022 in the Advanced [facility]pertension Clinic. She initially saw Dr. Dulce Sellar 07/2022 after an admission for hypertensive urgency. She was not on any medications prior to this event and was started on amlodipine. She first developed hypertension at age 72. She has a history of anaphylaxis with an ACE inhibitor. She was working on lifestyle and had lost 52 lbs, but blood pressure remained in the 200s/130s. Labs were negative for hyperaldosteronism. Metanephrines were normal. Renal artery dopplers were normal 07/2022. She was admitted to the hospital 09/2022 with a type B aortic dissection. She underwent TEVAR 09/20/22. She followed up with Dr. Dulce Sellar later that month and her home blood pressures were in the 110s-130s/70s. At the time, she was on carvedilol, clonidine, hydralazine, nifedipine, and spironolactone. She had an Echo 08/2022 that revealed LVEF 65-70% with normal diastolic function.   At her 11/2022 visit, her BP was 172/98 in the setting of forgetting to take her antihypertensives prior to leaving the house. She complained of some lightheadedness and dizziness; she was not quite used to her blood pressures being in the 110s to 120s. She has a family history of very early onset hypertension and I suspected that her etiology is genetic. She was encouraged to gradually increase her exercise. We discussed the importance of limiting all sodium intake with a goal of less than 2300 mg a day. She was struggling with symptoms from clonidine, so it was switched to 0.2 mg twice daily with a  goal of trying to wean it off if possible. Continued carvedilol, nifedipine, and spironolactone. We transitioned hydralazine to 100 mg twice daily instead of 25 mg every 6 hours.  She was not felt to be a good candidate for renal denervation due to her history of dissection down to the level of the SMA. She saw our pharmacist 12/2022 and her clonidine was switched from tablets to patches. In 01/2023 she was still seeing some home readings as high as 140-150 systolic, but more frequently in the normal range. At her follow-up 02/2023 she was mostly seeing home readings above normal. She complained of occasional dizzy spells which she thought were related to BP below 120 and being active.  At her appointment 03/2023 she was feeling well and blood pressures were mostly in the 120s to 130s.  She had occasional spikes in her blood pressure.  Blood pressure was 106/66 in the office.  She had a repeat CT scan at [facility] to monitor her aorta repair.  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  She also had postsurgical changes in the thoracic aorta endograft with decreased but persistent flow within the false lumen via retrograde flow.  They recommended continued follow-up with repeat imaging in 1 year.  Today, she is accompanied by her sister. She reports that for a week or two last month, her blood pressure was spiking and not decreasing. She was symptomatic at the time. She believes this was caused by not switching out her clonidine patch  on time. Since then, she has been making sure to change her clonidine patch consistently. She also receives clonidine tablets which she only takes as needed such as for those blood pressure spikes; she has not needed the tablets very often. At home her blood pressure has been averaging 120s-130s systolic with occasional 140s. No readings below 120s systolic. For exercise she is going to the gym for low weight/high rep  weight training. At work she is routinely achieving 15,000-20,000 steps. Sometimes at work she does moderate heavy lifting with groceries, which may occasionally cause her to feel lightheaded. She will then sit down and rest until her symptoms subside. She denies any palpitations, chest pain, shortness of breath, peripheral edema, headaches, syncope, orthopnea, or PND.  Previous antihypertensives: Lisinopril - syncope  Past Medical History:  Diagnosis Date   Hypertension    Resistant hypertension 09/18/2022    Past Surgical History:  Procedure Laterality Date   NO PAST SURGERIES      Current Medications: Current Meds  Medication Sig   aspirin EC 81 MG tablet Take 1 tablet (81 mg total) by mouth daily. Swallow whole.   carvedilol (COREG) 25 MG tablet Take 1 tablet (25 mg total) by mouth 2 (two) times daily.   chlorthalidone (HYGROTON) 25 MG tablet TAKE 1 TABLET (25 MG TOTAL) BY MOUTH DAILY.   cloNIDine (CATAPRES - DOSED IN MG/24 HR) 0.2 mg/24hr patch Place 0.2 mg onto the skin once a week.   hydrALAZINE (APRESOLINE) 100 MG tablet TAKE 1 TABLET BY MOUTH EVERY 12 HOURS   MAGNESIUM GLYCINATE PO Take 4 tablets by mouth daily.   medroxyPROGESTERone Acetate 150 MG/ML SUSY Inject 1 mL into the muscle every 3 (three) months.   NIFEdipine (PROCARDIA XL/NIFEDICAL-XL) 90 MG 24 hr tablet Take 1 tablet (90 mg total) by mouth daily.   oxyCODONE (OXY IR/ROXICODONE) 5 MG immediate release tablet Take 5 mg by mouth every 6 (six) hours as needed for pain.   polyethylene glycol (MIRALAX / GLYCOLAX) 17 g packet Take 17 g by mouth in the morning and at bedtime.   spironolactone (ALDACTONE) 25 MG tablet Take 25 mg by mouth daily.   [DISCONTINUED] cloNIDine (CATAPRES) 0.2 MG tablet Take 0.2 mg by mouth 2 (two) times daily.     Allergies:   Lisinopril   Social History   Socioeconomic History   Marital status: Significant Other    Spouse name: Not on file   Number of children: Not on file   Years of  education: Not on file   Highest education level: Not on file  Occupational History   Not on file  Tobacco Use   Smoking status: Never   Smokeless tobacco: Never  Vaping Use   Vaping status: Never Used  Substance and Sexual Activity   Alcohol use: Yes    Comment: Rare   Drug use: Yes    Types: Marijuana    Comment: Daily   Sexual activity: Not on file  Other Topics Concern   Not on file  Social History Narrative   Not on file   Social Determinants of Health   Financial Resource Strain: Not on file  Food Insecurity: No Food Insecurity (11/23/2022)   Hunger Vital Sign    Worried About Running Out of Food in the Last Year: Never true    Ran Out of Food in the Last Year: Never true  Transportation Needs: No Transportation Needs (11/23/2022)   PRAPARE - Administrator, Civil Service (Medical): No  Lack of Transportation (Non-Medical): No  Physical Activity: Inactive (11/23/2022)   Exercise Vital Sign    Days of Exercise per Week: 0 days    Minutes of Exercise per Session: 0 min  Stress: Not on file  Social Connections: Not on file     Family History: The patient's family history includes Breast cancer in her mother; Diabetes in her father and mother; Hypertension in her father, mother, and sister; Sleep apnea in her father.  ROS:   Please see the history of present illness.    (+) Occasional lightheadedness All other systems reviewed and are negative.  EKGs/Labs/Other Studies Reviewed:    CTA Chest/Abdomen/Pelvis  05/29/2023  ([facility]): 1.  Effacement of the sinotubular junction which can be seen in connective tissue disease.  2.  Postsurgical changes of thoracic aortic endograft placement with substantially decreased but persistent flow within the false lumen via retrograde flow.   CTA Chest/Abdomen/Pelvis  10/27/2022  ([facility]): CONCLUSION:  Postprocedural changes of thoracic endograft placement with improved caliber of the true lumen and  decreased size but persistent flow within the false lumen of the patient's prior type B aortic aneurysm. Majority of this flow appears retrograde via the distal thoracic/proximal suprarenal abdominal aorta intraluminal defect, however, there is a small persistent intraluminal defect contributing to flow just distal to the left subclavian branch as further described above.   TTE  09/16/2022  ([facility]): SUMMARY  The left ventricular size is normal with normal left ventricular wall  thickness.  Left ventricular systolic function is normal.  LV ejection fraction = 65-70%.  The right ventricle is normal in size and function.  There is no significant valvular stenosis or regurgitation.   Linear echodensity seen in abdominal aorta consistent with dissection  flap.   The IVC is normal in size with an inspiratory collapse of greater then  50%, suggesting normal right atrial pressure.  There is no pericardial effusion.  There is no comparison study available.   Bilateral Renal Artery Dopplers  07/21/2022: Summary:  Largest Aortic Diameter: 2.3 cm    Renal:    Right: Normal size right kidney. Normal right Resisitive Index. No         evidence of right renal artery stenosis.  Left:  Normal size of left kidney. Normal left Resistive Index. No         evidence of left renal artery stenosis.  Mesenteric:  Normal Superior Mesenteric artery and Celiac artery findings.   CTA Chest  09/26/2021: IMPRESSION: No pulmonary embolism. No radiographic evidence of acute cardiopulmonary disease.  EKG:  EKG is personally reviewed. 10/11/2023:  Sinus rhythm. Rate 65 bpm. 04/13/2023:  EKG was not ordered. 11/23/2022: EKG was not ordered.   Recent Labs: 01/04/2023: BUN 22; Creatinine, Ser 1.28; Potassium 3.5; Sodium 138   Recent Lipid Panel No results found for: "CHOL", "TRIG", "HDL", "CHOLHDL", "VLDL", "LDLCALC", "LDLDIRECT"  Physical Exam:    VS:  BP 127/82 (BP Location: Left Arm)   Pulse 65    Ht 5' 3.5" (1.613 m)   Wt 141 lb 9.6 oz (64.2 kg)   SpO2 98%   BMI 24.69 kg/m  , BMI Body mass index is 24.69 kg/m. GENERAL:  Well appearing HEENT: Pupils equal round and reactive, fundi not visualized, oral mucosa unremarkable NECK:  No jugular venous distention, waveform within normal limits, carotid upstroke brisk and symmetric, no bruits, no thyromegaly LUNGS:  Clear to auscultation bilaterally HEART:  RRR.  PMI not displaced or sustained,S1  and S2 within normal limits, no S3, no S4, no clicks, no rubs, no murmurs ABD:  Flat, positive bowel sounds normal in frequency in pitch, no bruits, no rebound, no guarding, no midline pulsatile mass, no hepatomegaly, no splenomegaly EXT:  2 plus pulses throughout, no edema, no cyanosis no clubbing SKIN:  No rashes no nodules NEURO:  Cranial nerves II through XII grossly intact, motor grossly intact throughout PSYCH:  Cognitively intact, oriented to person place and time   ASSESSMENT/PLAN:     # Hypertension Recent episode of elevated blood pressure likely due to inconsistent use of Clonidine patch. Currently controlled with Clonidine patch 0.2mg  and occasional use of Clonidine pill 0.2mg  for spikes. -Continue Clonidine patch 0.2mg .  Continue carvedilol, chlorthalidone, hydralazine, nifedipine and spironolactone.  -Use Clonidine pill 0.2mg  as needed for blood pressure spikes. -Modify prescription to include extra pills for emergency use. -Check cholesterol, kidney function, and liver function today.  # Type B aortic dissection:  Regular gym visits and high step count from work. Incorporates weight lifting into routine.  Reminded her no heavy lifting given her aortic dissection repair.  She continues to follow up with Vascular Surgery at [facility].    Follow-up in 6 months or as needed.        Screening for Secondary Hypertension:     11/23/2022    9:26 AM  Causes  Drugs/Herbals Screened     - Comments limits sodium intake,   cooks and eats out, one coffee daily  Renovascular HTN Screened  Sleep Apnea Screened     - Comments snores.  no other symptoms  Coarctation of the Aorta Screened     - Comments BP symmetric    Relevant Labs/Studies:    Latest Ref Rng & Units 01/04/2023   10:06 AM 07/20/2022   10:31 AM 04/11/2022    2:07 PM  Basic Labs  Sodium 134 - 144 mmol/L 138  140  138   Potassium 3.5 - 5.2 mmol/L 3.5  3.5  3.1   Creatinine 0.57 - 1.00 mg/dL 4.40  3.47  4.25           Latest Ref Rng & Units 07/20/2022   10:31 AM  Renin/Aldosterone   Aldosterone 0.0 - 30.0 ng/dL 95.6   Renin 3.875 - 6.433 ng/mL/hr 7.362   Aldos/Renin Ratio 0.0 - 30.0 2.8              07/21/2022    8:31 AM  Renovascular   Renal Artery [place] Completed Yes     Disposition:    FU with APP in 6 months. FU with Zuley Lutter C. Duke Salvia, MD, [facility] in 1 year.  Medication Adjustments/Labs and Tests Ordered: Current medicines are reviewed at length with the patient today.  Concerns regarding medicines are outlined above.   Orders Placed This Encounter  Procedures   EKG 12-Lead   Meds ordered this encounter  Medications   cloNIDine (CATAPRES) 0.2 MG tablet    Sig: AS NEEDED FOR BLOOD PRESSURE ABOVE 150    Dispense:  30 tablet    Refill:  1    AWARE PATIENT IS ON PATCH   I,Mathew Stumpf,acting as a scribe for Chilton Si, MD.,have documented all relevant documentation on the behalf of Chilton Si, MD,as directed by  Chilton Si, MD while in the presence of Chilton Si, MD.  I, Quention Mcneill C. Duke Salvia, MD have reviewed all documentation for this visit.  The documentation of the exam, diagnosis, procedures, and orders on 10/11/2023 are all  accurate and complete.  Guadalupe Maple  10/11/2023 11:00 AM    [facility]

## 2023-10-12 LAB — COMPREHENSIVE METABOLIC PANEL
ALT: 8 [IU]/L (ref 0–32)
AST: 11 [IU]/L (ref 0–40)
Albumin: 4.5 g/dL (ref 3.9–4.9)
Alkaline Phosphatase: 44 [IU]/L (ref 44–121)
BUN/Creatinine Ratio: 11 (ref 9–23)
BUN: 17 mg/dL (ref 6–20)
Bilirubin Total: 1 mg/dL (ref 0.0–1.2)
CO2: 23 mmol/L (ref 20–29)
Calcium: 10.1 mg/dL (ref 8.7–10.2)
Chloride: 101 mmol/L (ref 96–106)
Creatinine, Ser: 1.56 mg/dL — ABNORMAL HIGH (ref 0.57–1.00)
Globulin, Total: 2.4 g/dL (ref 1.5–4.5)
Glucose: 114 mg/dL — ABNORMAL HIGH (ref 70–99)
Potassium: 3.6 mmol/L (ref 3.5–5.2)
Sodium: 138 mmol/L (ref 134–144)
Total Protein: 6.9 g/dL (ref 6.0–8.5)
eGFR: 44 mL/min/{1.73_m2} — ABNORMAL LOW (ref >59)

## 2023-10-12 LAB — CBC WITH DIFFERENTIAL/PLATELET
Basophils Absolute: 0.1 10*3/uL (ref 0.0–0.2)
Basos: 1 %
EOS (ABSOLUTE): 0.2 10*3/uL (ref 0.0–0.4)
Eos: 3 %
Hematocrit: 45.5 % (ref 34.0–46.6)
Hemoglobin: 15 g/dL (ref 11.1–15.9)
Immature Grans (Abs): 0 10*3/uL (ref 0.0–0.1)
Immature Granulocytes: 0 %
Lymphocytes Absolute: 1.7 10*3/uL (ref 0.7–3.1)
Lymphs: 30 %
MCH: 28.6 pg (ref 26.6–33.0)
MCHC: 33 g/dL (ref 31.5–35.7)
MCV: 87 fL (ref 79–97)
Monocytes Absolute: 0.4 10*3/uL (ref 0.1–0.9)
Monocytes: 7 %
Neutrophils Absolute: 3.3 10*3/uL (ref 1.4–7.0)
Neutrophils: 59 %
Platelets: 240 10*3/uL (ref 150–450)
RBC: 5.24 x10E6/uL (ref 3.77–5.28)
RDW: 11.9 % (ref 11.7–15.4)
WBC: 5.6 10*3/uL (ref 3.4–10.8)

## 2023-10-12 LAB — LIPID PANEL
Chol/HDL Ratio: 2.5 {ratio} (ref 0.0–4.4)
Cholesterol, Total: 202 mg/dL — ABNORMAL HIGH (ref 100–199)
HDL: 81 mg/dL (ref >39)
LDL Chol Calc (NIH): 104 mg/dL — ABNORMAL HIGH (ref 0–99)
Triglycerides: 97 mg/dL (ref 0–149)
VLDL Cholesterol Cal: 17 mg/dL (ref 5–40)

## 2023-10-12 LAB — HEMOGLOBIN A1C
Est. average glucose Bld gHb Est-mCnc: 114 mg/dL
Hgb A1c MFr Bld: 5.6 % (ref 4.8–5.6)

## 2024-01-16 ENCOUNTER — Other Ambulatory Visit (HOSPITAL_BASED_OUTPATIENT_CLINIC_OR_DEPARTMENT_OTHER): Payer: Self-pay | Admitting: Cardiovascular Disease

## 2024-01-16 ENCOUNTER — Encounter: Payer: Self-pay | Admitting: Cardiology

## 2024-01-16 ENCOUNTER — Ambulatory Visit: Payer: Managed Care, Other (non HMO) | Attending: Cardiology | Admitting: Cardiology

## 2024-01-16 VITALS — BP 120/80 | HR 76 | Ht 63.5 in | Wt 149.0 lb

## 2024-01-16 DIAGNOSIS — I1A0 Resistant hypertension: Secondary | ICD-10-CM | POA: Diagnosis not present

## 2024-01-16 DIAGNOSIS — I71012 Dissection of descending thoracic aorta: Secondary | ICD-10-CM | POA: Diagnosis not present

## 2024-01-16 DIAGNOSIS — N183 Chronic kidney disease, stage 3 unspecified: Secondary | ICD-10-CM | POA: Diagnosis not present

## 2024-01-16 NOTE — Progress Notes (Signed)
  Cardiology Office Note:  .   Date:  01/16/2024  ID:  Stephanie Evans, DOB 17-Jul-1986, MRN [id number] PCP: Julianne Handler, NP  [facility] Providers Cardiologist:  Norman Herrlich, MD    History of Present Illness: .   Stephanie Evans is a 38 y.o. female with a past medical history of resistant hypertension, dissection of descending thoracic aorta s/p TEVAR 09/20/2022.  09/20/2022 TEVAR 07/21/2022 renal ultrasound no evidence of RAS  Most recently evaluated by Dr. Dulce Sellar in January 2024, She was stable from a cardiac perspective, still having elevated blood pressure readings but also followed by Pharm.D. and advanced hypertension clinic for management of her hypertension.  She presents today for follow-up of hypertension.  From this perspective, she is doing well, her blood pressure is well-controlled in the office today at 120/80 and she is getting similar blood pressure readings at home.  She is tolerating her medications well, on occasion she notices some orthostasis when standing quickly.  She is doing her best to stay well-hydrated.  She stays physically active at [facility]. She denies chest pain, palpitations, dyspnea, pnd, orthopnea, n, v, dizziness, syncope, edema, weight gain, or early satiety.     ROS: Review of Systems  Neurological:  Positive for dizziness (occasional with position changes).     Studies Reviewed: .            Risk Assessment/Calculations:             Physical Exam:   VS:  BP 120/80 (BP Location: Left Arm, Patient Position: Sitting, Cuff Size: Normal)   Pulse 76   Ht 5' 3.5" (1.613 m)   Wt 149 lb (67.6 kg)   SpO2 96%   BMI 25.98 kg/m    Wt Readings from Last 3 Encounters:  01/16/24 149 lb (67.6 kg)  10/11/23 141 lb 9.6 oz (64.2 kg)  04/13/23 138 lb 14.4 oz (63 kg)    GEN: Well nourished, well developed in no acute distress NECK: No JVD; No carotid bruits CARDIAC: RRR, no murmurs, rubs, gallops RESPIRATORY:  Clear to auscultation  without rales, wheezing or rhonchi  ABDOMEN: Soft, non-tender, non-distended EXTREMITIES:  No edema; No deformity   ASSESSMENT AND PLAN: .   Resistant hypertension-blood pressure is well-controlled today at 120/80.  She is following closely with the advanced hypertension clinic.  She will follow-up with her APP around April and Dr. Duke Salvia around October.  Continue Coreg 25 mg twice daily, continue chlorthalidone 25 mg daily, continue clonidine patch 0.2 mg once weekly, continue Apresoline 100 mg twice daily, continue nifedipine 90 mg daily, continue spironolactone 25 mg daily.  Type B aortic dissection- s/p TEVAR in 2023 >> follows with Vascular at [facility].   CKD - most recent GFR 44, she is concerned with her kidney function and tries to stay hydrated.        Dispo: She is following closely with advanced HTN clinic, she can follow up with general cardiology in 03/2025 ~ 6 months after she will see Dr. Duke Salvia.   Signed, Flossie Dibble, NP

## 2024-01-16 NOTE — Patient Instructions (Signed)
Medication Instructions:  Your physician recommends that you continue on your current medications as directed. Please refer to the Current Medication list given to you today.  *If you need a refill on your cardiac medications before your next appointment, please call your pharmacy*   Lab Work: None Ordered If you have labs (blood work) drawn today and your tests are completely normal, you will receive your results only by: MyChart Message (if you have MyChart) OR A paper copy in the mail If you have any lab test that is abnormal or we need to change your treatment, we will call you to review the results.   Testing/Procedures: None Ordered   Follow-Up: At [facility], you and your health needs are our priority.  As part of our continuing mission to provide you with exceptional heart care, we have created designated Provider Care Teams.  These Care Teams include your primary Cardiologist (physician) and Advanced Practice Providers (APPs -  Physician Assistants and Nurse Practitioners) who all work together to provide you with the care you need, when you need it.  We recommend signing up for the patient portal called "MyChart".  Sign up information is provided on this After Visit Summary.  MyChart is used to connect with patients for Virtual Visits (Telemedicine).  Patients are able to view lab/test results, encounter notes, upcoming appointments, etc.  Non-urgent messages can be sent to your provider as well.   To learn more about what you can do with MyChart, go to [email or web address].    Your next appointment:   Follow up in April 2026

## 2024-02-11 ENCOUNTER — Other Ambulatory Visit (HOSPITAL_BASED_OUTPATIENT_CLINIC_OR_DEPARTMENT_OTHER): Payer: Self-pay | Admitting: Cardiovascular Disease

## 2024-02-18 ENCOUNTER — Other Ambulatory Visit (HOSPITAL_BASED_OUTPATIENT_CLINIC_OR_DEPARTMENT_OTHER): Payer: Self-pay | Admitting: Cardiovascular Disease

## 2024-03-04 ENCOUNTER — Other Ambulatory Visit (HOSPITAL_BASED_OUTPATIENT_CLINIC_OR_DEPARTMENT_OTHER): Payer: Self-pay | Admitting: Cardiovascular Disease

## 2024-04-18 ENCOUNTER — Other Ambulatory Visit (HOSPITAL_BASED_OUTPATIENT_CLINIC_OR_DEPARTMENT_OTHER): Payer: Self-pay | Admitting: Cardiovascular Disease

## 2024-04-18 NOTE — Telephone Encounter (Signed)
 Rx refill sent to pharmacy.

## 2024-05-17 ENCOUNTER — Ambulatory Visit (INDEPENDENT_AMBULATORY_CARE_PROVIDER_SITE_OTHER): Admitting: Cardiovascular Disease

## 2024-05-17 ENCOUNTER — Encounter (HOSPITAL_BASED_OUTPATIENT_CLINIC_OR_DEPARTMENT_OTHER): Payer: Self-pay | Admitting: Cardiovascular Disease

## 2024-05-17 VITALS — BP 154/86 | HR 68 | Ht 63.5 in | Wt 148.1 lb

## 2024-05-17 DIAGNOSIS — I7100 Dissection of unspecified site of aorta: Secondary | ICD-10-CM

## 2024-05-17 DIAGNOSIS — I1A0 Resistant hypertension: Secondary | ICD-10-CM

## 2024-05-17 NOTE — Progress Notes (Signed)
 Advanced Hypertension Clinic Follow-up:    Date:  05/17/2024   ID:  Stephanie Evans, DOB [birth year], MRN [id number]  PCP:  Trudi Fus, NP  Cardiologist:  Zoe Hinds, MD  Nephrologist:  Referring MD: Trudi Fus, NP   CC: Hypertension  History of Present Illness:    Stephanie Evans is a 38 y.o. female with a hx of hypertension, Type B aortic dissection s/p TEVAR, and CKD III, here for follow-up. She was initially seen 11/23/2022 in the [facility]. She initially saw Dr. Sandee Crook 07/2022 after an admission for hypertensive urgency. She was not on any medications prior to this event and was started on amlodipine . She first developed hypertension at age 33. She has a history of anaphylaxis with an ACE inhibitor. She was working on lifestyle and had lost 52 lbs, but blood pressure remained in the 200s/130s. Labs were negative for hyperaldosteronism. Metanephrines were normal. Renal artery dopplers were normal 07/2022. She was admitted to the hospital 09/2022 with a type B aortic dissection. She underwent TEVAR 09/20/22. She followed up with Dr. Sandee Crook later that month and her home blood pressures were in the 110s-130s/70s. At the time, she was on carvedilol , clonidine , hydralazine , nifedipine , and spironolactone. She had an Echo 08/2022 that revealed LVEF 65-70% with normal diastolic function.   At her 11/2022 visit, her BP was 172/98 in the setting of forgetting to take her antihypertensives prior to leaving the house. She complained of some lightheadedness and dizziness; she was not quite used to her blood pressures being in the 110s to 120s. She has a family history of very early onset hypertension and I suspected that her etiology is genetic. She was encouraged to gradually increase her exercise. We discussed the importance of limiting all sodium intake with a goal of less than 2300 mg a day. She was struggling with symptoms from clonidine , so it was switched to 0.2 mg twice  daily with a goal of trying to wean it off if possible. Continued carvedilol , nifedipine , and spironolactone. We transitioned hydralazine  to 100 mg twice daily instead of 25 mg every 6 hours.  She was not felt to be a good candidate for renal denervation due to her history of dissection down to the level of the SMA. She saw our pharmacist 12/2022 and her clonidine  was switched from tablets to patches. In 01/2023 she was still seeing some home readings as high as 140-150 systolic, but more frequently in the normal range. At her follow-up 02/2023 she was mostly seeing home readings above normal. She complained of occasional dizzy spells which she thought were related to BP below 120 and being active.  At her appointment 03/2023 she was feeling well and blood pressures were mostly in the 120s to 130s.  She had occasional spikes in her blood pressure.  Blood pressure was 106/66 in the office.  She had a repeat CT scan at [facility] to monitor her aorta repair.  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  She also had postsurgical changes in the thoracic aorta endograft with decreased but persistent flow within the false lumen via retrograde flow.  They recommended continued follow-up with repeat imaging in 1 year.  At her visit 09/2023 she was doing well.  Discussed the use of AI scribe software for clinical note transcription with the patient, who gave verbal consent to proceed.  History of Present Illness Stephanie Evans has been experiencing elevated blood  pressure readings, with a recent measurement of 192/129 mmHg. After taking her night medication and resting, her blood pressure decreased to 134 mmHg. She notes that her blood pressure readings are usually not this high and attributes the recent spike to missing some of her evening medication doses due to fatigue.  She has also been under stress after her mom's recent passing.   Her current  medication regimen includes carvedilol , chlorthalidone , clonidine , hydralazine , nifedipine , and spironolactone. In the evening, she takes nifedipine , carvedilol , and hydralazine . She has recently purchased a pill organizer to help manage her medication schedule more effectively.  She mentions a recent weight loss of five pounds following her mother's passing on May 5th, but believes she has regained some of the weight. She has been cooking at home more often but lacks a set eating schedule. She exercises once or twice a week and plans to increase this frequency during her upcoming days off.   She is due for a CT scan soon to monitor the aneurysm.  During the review of symptoms, she reports feeling good during exercise without significant fatigue. She also mentions experiencing a headache, which led her fianc to suggest checking her blood pressure.   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 Swallow whole.   carvedilol  (COREG ) 25 MG tablet TAKE 1 TABLET BY MOUTH TWICE A DAY   chlorthalidone  (HYGROTON ) 25 MG tablet TAKE 1 TABLET (25 MG TOTAL) BY MOUTH DAILY.   cloNIDine  (CATAPRES  - DOSED IN MG/24 HR) 0.2 mg/24hr patch Place 0.2 mg onto the skin once a week.   cloNIDine  (CATAPRES ) 0.2 MG tablet AS NEEDED FOR BLOOD PRESSURE ABOVE 150   hydrALAZINE  (APRESOLINE ) 100 MG tablet TAKE 1 TABLET BY MOUTH EVERY 12 HOURS   MAGNESIUM GLYCINATE PO Take 4 tablets by mouth daily.   NIFEdipine  (PROCARDIA  XL/NIFEDICAL-XL) 90 MG 24 hr tablet TAKE 1 TABLET BY MOUTH EVERY DAY   spironolactone (ALDACTONE) 25 MG tablet Take 1 tablet (25 mg total) by mouth daily.     Allergies:   Lisinopril    Social History    Socioeconomic History   Marital status: Significant Other    Spouse name: Not on file   Number of children: Not on file   Years of education: Not on file   Highest education level: Not on file  Occupational History   Not on file  Tobacco Use   Smoking status: Never   Smokeless tobacco: Never  Vaping Use   Vaping status: Never Used  Substance and Sexual Activity   Alcohol use: Yes    Comment: Rare   Drug use: Yes    Types: Marijuana    Comment: Daily   Sexual activity: Not on file  Other Topics Concern   Not on file  Social History Narrative   Not on file   Social Drivers of Health   Financial Resource Strain: Not on file  Food Insecurity: No Food Insecurity (11/23/2022)   Hunger Vital Sign    Worried About Running Out of Food in the Last Year: Never true    Ran Out of Food in the Last Year: Never true  Transportation Needs: No Transportation Needs (11/23/2022)  PRAPARE - Administrator, Civil Service (Medical): No    Lack of Transportation (Non-Medical): No  Physical Activity: Inactive (11/23/2022)   Exercise Vital Sign    Days of Exercise per Week: 0 days    Minutes of Exercise per Session: 0 min  Stress: Not on file  Social Connections: Not on file     Family History: The patient's family history includes Breast cancer in her mother; Diabetes in her father and mother; Hypertension in her father, mother, and sister; Sleep apnea in her father.  ROS:   Please see the history of present illness.    (+) Occasional lightheadedness All other systems reviewed and are negative.  EKGs/Labs/Other Studies Reviewed:    CTA Chest/Abdomen/Pelvis  05/29/2023  ([facility]): 1.  Effacement of the sinotubular junction which can be seen in connective tissue disease.  2.  Postsurgical changes of thoracic aortic endograft placement with substantially decreased but persistent flow within the false lumen via retrograde flow.   CTA Chest/Abdomen/Pelvis  10/27/2022   ([facility]): CONCLUSION:  Postprocedural changes of thoracic endograft placement with improved caliber of the true lumen and decreased size but persistent flow within the false lumen of the patient's prior type B aortic aneurysm. Majority of this flow appears retrograde via the distal thoracic/proximal suprarenal abdominal aorta intraluminal defect, however, there is a small persistent intraluminal defect contributing to flow just distal to the left subclavian branch as further described above.   TTE  09/16/2022  ([facility]): SUMMARY  The left ventricular size is normal with normal left ventricular wall  thickness.  Left ventricular systolic function is normal.  LV ejection fraction = 65-70%.  The right ventricle is normal in size and function.  There is no significant valvular stenosis or regurgitation.   Linear echodensity seen in abdominal aorta consistent with dissection  flap.   The IVC is normal in size with an inspiratory collapse of greater then  50%, suggesting normal right atrial pressure.  There is no pericardial effusion.  There is no comparison study available.   Bilateral Renal Artery Dopplers  07/21/2022: Summary:  Largest Aortic Diameter: 2.3 cm    Renal:    Right: Normal size right kidney. Normal right Resisitive Index. No         evidence of right renal artery stenosis.  Left:  Normal size of left kidney. Normal left Resistive Index. No         evidence of left renal artery stenosis.  Mesenteric:  Normal Superior Mesenteric artery and Celiac artery findings.   CTA Chest  09/26/2021: IMPRESSION: No pulmonary embolism. No radiographic evidence of acute cardiopulmonary disease.  EKG:  EKG is personally reviewed. 10/11/2023:  Sinus rhythm. Rate 65 bpm. 04/13/2023:  EKG was not ordered. 11/23/2022: EKG was not ordered.   Recent Labs: 10/11/2023: ALT 8; BUN 17; Creatinine, Ser 1.56; Hemoglobin 15.0; Platelets 240; Potassium 3.6; Sodium 138    Recent Lipid Panel    Component Value Date/Time   CHOL 202 (H) 10/11/2023 1111   TRIG 97 10/11/2023 1111   HDL 81 10/11/2023 1111   CHOLHDL 2.5 10/11/2023 1111   LDLCALC 104 (H) 10/11/2023 1111    Physical Exam:    VS:  BP (!) 154/86   Pulse 68   Ht 5' 3.5" (1.613 m)   Wt 148 lb 1.6 oz (67.2 kg)   SpO2 97%   BMI 25.82 kg/m  , BMI Body mass index is 25.82 kg/m. GENERAL:  Well appearing  HEENT: Pupils equal round and reactive, fundi not visualized, oral mucosa unremarkable NECK:  No jugular venous distention, waveform within normal limits, carotid upstroke brisk and symmetric, no bruits, no thyromegaly LUNGS:  Clear to auscultation bilaterally HEART:  RRR.  PMI not displaced or sustained,S1 and S2 within normal limits, no S3, no S4, no clicks, no rubs, no murmurs ABD:  Flat, positive bowel sounds normal in frequency in pitch, no bruits, no rebound, no guarding, no midline pulsatile mass, no hepatomegaly, no splenomegaly EXT:  2 plus pulses throughout, no edema, no cyanosis no clubbing SKIN:  No rashes no nodules NEURO:  Cranial nerves II through XII grossly intact, motor grossly intact throughout PSYCH:  Cognitively intact, oriented to person place and time   ASSESSMENT/PLAN:    Assessment & Plan # Malignant hypertension:  Episodes of elevated blood pressure, improved with nighttime medication. Non-adherence due to fatigue. Her aortic dissection terminates near the SMA.  We discussed renal denervation.  However, would avoid given her history of dissection. Emphasized medication adherence and home monitoring. - Continue carvedilol , chlorthalidone , clonidine , hydralazine , nifedipine , and spironolactone. - Use AM/PM pill organizer to improve adherence. - Monitor blood pressure at home, report via MyChart over 1-2 weeks.  BP goal <130/80.  It has been very well-controlled on this regimen until recently. - Reassess blood pressure control in 3 months.  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 S/p TEVAR.  Location precludes renal denervation. - Follow up with vascular surgery for CT scan in June. - BP control as above    Screening for Secondary Hypertension:     11/23/2022    9:26 AM  Causes  Drugs/Herbals Screened     - Comments limits sodium intake,  cooks and eats out, one coffee daily  Renovascular HTN Screened  Sleep Apnea Screened     - Comments snores.  no other symptoms  Coarctation of the Aorta Screened     - Comments BP symmetric    Relevant Labs/Studies:    Latest Ref Rng & Units 10/11/2023   11:11 AM 01/04/2023   10:06 AM 07/20/2022   10:31 AM  Basic Labs  Sodium 134 - 144 mmol/L 138  138  140   Potassium 3.5 - 5.2 mmol/L 3.6  3.5  3.5   Creatinine 0.57 - 1.00 mg/dL 3.08  6.57  8.46           Latest Ref Rng & Units 07/20/2022   10:31 AM  Renin/Aldosterone   Aldosterone 0.0 - 30.0 ng/dL 96.2   Renin 9.528 - 4.132 ng/mL/hr 7.362   Aldos/Renin Ratio 0.0 - 30.0 2.8              07/21/2022    8:31 AM  Renovascular   Renal Artery US  Completed Yes     Disposition:    FU with APP in 6 months. FU with Jacier Gladu C. Theodis Fiscal, MD, [facility] in 3 months  Medication Adjustments/Labs and Tests Ordered: Current medicines are reviewed at length with the patient today.  Concerns regarding medicines are outlined above.   No orders of the defined types were placed in this encounter.  No orders of the defined types were placed in this encounter.   Signed, Maudine Sos, MD  05/17/2024 5:35 PM    [facility]

## 2024-05-17 NOTE — Patient Instructions (Addendum)
 Medication Instructions:  Your physician recommends that you continue on your current medications as directed. Please refer to the Current Medication list given to you today.   Labwork: NONE  Testing/Procedures: NONE  Follow-Up: 3 MONTHS WITH DR Sidell OR CAITLIN W NP   Any Other Special Instructions Will Be Listed Below (If Applicable). MONITOR YOUR BLOOD PRESSURE TWICE A DAY. SEND YOUR READINGS IN VIA MYCHART   If you need a refill on your cardiac medications before your next appointment, please call your pharmacy.

## 2024-07-02 ENCOUNTER — Encounter (HOSPITAL_BASED_OUTPATIENT_CLINIC_OR_DEPARTMENT_OTHER): Payer: Self-pay | Admitting: Cardiovascular Disease

## 2024-07-02 MED ORDER — CLONIDINE 0.2 MG/24HR TD PTWK: 0.2000 mg | MEDICATED_PATCH | TRANSDERMAL | 3 refills | Status: AC

## 2024-08-15 ENCOUNTER — Other Ambulatory Visit: Payer: Self-pay

## 2024-08-15 ENCOUNTER — Encounter (HOSPITAL_BASED_OUTPATIENT_CLINIC_OR_DEPARTMENT_OTHER): Payer: Self-pay | Admitting: Family

## 2024-08-15 ENCOUNTER — Ambulatory Visit (HOSPITAL_BASED_OUTPATIENT_CLINIC_OR_DEPARTMENT_OTHER): Admitting: Family

## 2024-08-15 VITALS — BP 103/63 | HR 60 | Ht 63.5 in | Wt 145.9 lb

## 2024-08-15 DIAGNOSIS — Z9889 Other specified postprocedural states: Secondary | ICD-10-CM

## 2024-08-15 DIAGNOSIS — Z8679 Personal history of other diseases of the circulatory system: Secondary | ICD-10-CM

## 2024-08-15 DIAGNOSIS — I1A0 Resistant hypertension: Secondary | ICD-10-CM

## 2024-08-15 MED ORDER — HYDRALAZINE HCL 100 MG PO TABS: 100.0000 mg | ORAL_TABLET | Freq: Two times a day (BID) | ORAL | 2 refills | Status: AC

## 2024-08-15 MED ORDER — CLONIDINE HCL 0.2 MG PO TABS: ORAL_TABLET | ORAL | 1 refills | Status: DC

## 2024-08-15 MED ORDER — CHLORTHALIDONE 25 MG PO TABS: 25.0000 mg | ORAL_TABLET | Freq: Every day | ORAL | 2 refills | Status: AC

## 2024-08-15 NOTE — Patient Instructions (Addendum)
 Medication Instructions:  Continue your current medications    Follow-Up: Please follow up in December  in ADV HTN CLINIC with Dr. Raford, Reche Finder, NP or Allean Mink PharmD    Special Instructions:   If you have continued issues with Clonidine  patch even after talking with pharmacy, let us  know.  If you start getting lightheaded, dizziness with low BP, let us  know.   If BP at home always less than 120/70 after medications, let us  know and we may reduce regimen.

## 2024-08-15 NOTE — Progress Notes (Signed)
 Advanced Hypertension Clinic Assessment:    Date:  08/15/2024   ID:  Stephanie Evans, DOB [birth year], MRN [id number]  PCP:  Benjamine Lauraine DASEN, NP  Cardiologist:  Redell Leiter, MD  Nephrologist:  Referring MD: Benjamine Lauraine DASEN, NP   CC: Hypertension  History of Present Illness:    Stephanie Evans is a 38 y.o. female with a hx of hypertension, type B aortic dissection s/p TEVAR 09/2022.  Previous metanephrines unremarkable.  Renal artery Dopplers normal 07/2022.  Echo 08/2022 normal LVEF 65 to 70% with normal diastolic function.  Renal denervation previously deferred due to location of aorta repair.  Presents today for follow-up independently. BP at home as high as 130s/70s.  She will get some relatively hypotensive readings but these are asymptomatic with no lightheadedness, dizziness. She has been doing cross fit 3x per week for the last 8 weeks for exercise but limiting her weights due to her history of TEVAR. Reports no shortness of breath nor dyspnea on exertion. Reports no chest pain, pressure, or tightness. No edema, orthopnea, PND. Reports no palpitations.  Enjoys spending time with her children who are 42, 46, and 42 years old. Does note her newest clonidine  patch from pharmacy does not seem to adhere as well and has had to use some PO Clonidine , requests refill.  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 Swallow whole.   carvedilol  (COREG ) 25 MG tablet TAKE 1 TABLET BY MOUTH TWICE A DAY   chlorthalidone  (HYGROTON ) 25 MG tablet TAKE 1 TABLET (25 MG TOTAL) BY MOUTH DAILY.   cloNIDine  (CATAPRES  - DOSED IN MG/24 HR) 0.2 mg/24hr patch Place 1 patch (0.2 mg total) onto the skin once a week.   cloNIDine   (CATAPRES ) 0.2 MG tablet AS NEEDED FOR BLOOD PRESSURE ABOVE 150   hydrALAZINE  (APRESOLINE ) 100 MG tablet TAKE 1 TABLET BY MOUTH EVERY 12 HOURS   MAGNESIUM GLYCINATE PO Take 4 tablets by mouth daily.   NIFEdipine  (PROCARDIA  XL/NIFEDICAL-XL) 90 MG 24 hr tablet TAKE 1 TABLET BY MOUTH EVERY DAY   spironolactone (ALDACTONE) 25 MG tablet Take 1 tablet (25 mg total) by mouth daily.     Allergies:   Lisinopril    Social History   Socioeconomic History   Marital status: Significant Other    Spouse name: Not on file   Number of children: Not on file   Years of education: Not on file   Highest education level: Not on file  Occupational History   Not on file  Tobacco Use   Smoking status: Never   Smokeless tobacco: Never  Vaping Use   Vaping status: Never Used  Substance and Sexual Activity   Alcohol use: Yes    Comment: Rare   Drug use: Yes    Types: Marijuana    Comment: Daily   Sexual activity: Not on file  Other Topics Concern   Not on file  Social History Narrative   Not on file   Social Drivers of Health   Financial Resource Strain: Not on file  Food Insecurity: No Food Insecurity (11/23/2022)   Hunger Vital Sign    Worried About Running Out of Food in the Last Year: Never true  Ran Out of Food in the Last Year: Never true  Transportation Needs: No Transportation Needs (11/23/2022)   PRAPARE - Administrator, Civil Service (Medical): No    Lack of Transportation (Non-Medical): No  Physical Activity: Inactive (11/23/2022)   Exercise Vital Sign    Days of Exercise per Week: 0 days    Minutes of Exercise per Session: 0 min  Stress: Not on file  Social Connections: Not on file     Family History: The patient's family history includes Breast cancer in her mother; Diabetes in her father and mother; Hypertension in her father, mother, and sister; Sleep apnea in her father.  ROS:   Please see the history of present illness.     All other systems reviewed and are  negative.  EKGs/Labs/Other Studies Reviewed:         Recent Labs: 10/11/2023: ALT 8; BUN 17; Creatinine, Ser 1.56; Hemoglobin 15.0; Platelets 240; Potassium 3.6; Sodium 138   Recent Lipid Panel    Component Value Date/Time   CHOL 202 (H) 10/11/2023 1111   TRIG 97 10/11/2023 1111   HDL 81 10/11/2023 1111   CHOLHDL 2.5 10/11/2023 1111   LDLCALC 104 (H) 10/11/2023 1111    Physical Exam:   VS:  BP 103/63 (BP Location: Left Arm, Patient Position: Sitting, Cuff Size: Normal)   Pulse 60   Ht 5' 3.5 (1.613 m)   Wt 145 lb 14.4 oz (66.2 kg)   SpO2 98%   BMI 25.44 kg/m  , BMI Body mass index is 25.44 kg/m. GENERAL:  Well appearing HEENT: Pupils equal round and reactive, fundi not visualized, oral mucosa unremarkable NECK:  No jugular venous distention, waveform within normal limits, carotid upstroke brisk and symmetric, no bruits, no thyromegaly LYMPHATICS:  No cervical adenopathy LUNGS:  Clear to auscultation bilaterally HEART:  RRR.  PMI not displaced or sustained,S1 and S2 within normal limits, no S3, no S4, no clicks, no rubs, no murmurs ABD:  Flat, positive bowel sounds normal in frequency in pitch, no bruits, no rebound, no guarding, no midline pulsatile mass, no hepatomegaly, no splenomegaly EXT:  2 plus pulses throughout, no edema, no cyanosis no clubbing SKIN:  No rashes no nodules NEURO:  Cranial nerves II through XII grossly intact, motor grossly intact throughout PSYCH:  Cognitively intact, oriented to person place and time   ASSESSMENT/PLAN:    Malignant hypertension - BP well controlled. Continue current antihypertensive regimen clonidine  patch 0.2 mg weekly, carvedilol  25 mg twice daily, chlorthalidone  25 mg daily, hydralazine  100 mg 3 times daily, nifedipine  90 mg daily, spironolactone 25 mg daily.  Relatively hypotensive in clinic but asymptomatic with no lightheadedness, dizziness.  If she develops BP routinely less than 120/60 or symptomatic lightheadedness at home  she will contact her office and consider reducing dose of clonidine  or hydralazine .  She notes difficulty with the adhesive on her clonidine  patch, encouraged her to discuss with her pharmacy which she will do.  Will provide a refill of clonidine  tablets as needed for SBP greater than 150.  Discussed to monitor BP at home at least 2 hours after medications and sitting for 5-10 minutes.  Recommend aiming for 150 minutes of moderate intensity activity per week and following a heart healthy diet.    Type B aortic dissection s/p TEVAR 09/2022 - Follows with vascular surgery at [facility].  Screening for Secondary Hypertension:     11/23/2022    9:26 AM  Causes  Drugs/Herbals Screened     -  Comments limits sodium intake,  cooks and eats out, one coffee daily  Renovascular HTN Screened  Sleep Apnea Screened     - Comments snores.  no other symptoms  Coarctation of the Aorta Screened     - Comments BP symmetric    Relevant Labs/Studies:    Latest Ref Rng & Units 10/11/2023   11:11 AM 01/04/2023   10:06 AM 07/20/2022   10:31 AM  Basic Labs  Sodium 134 - 144 mmol/L 138  138  140   Potassium 3.5 - 5.2 mmol/L 3.6  3.5  3.5   Creatinine 0.57 - 1.00 mg/dL 8.43  8.71  8.47           Latest Ref Rng & Units 07/20/2022   10:31 AM  Renin/Aldosterone   Aldosterone 0.0 - 30.0 ng/dL 79.0   Renin 9.832 - 4.619 ng/mL/hr 7.362   Aldos/Renin Ratio 0.0 - 30.0 2.8              07/21/2022    8:31 AM  Renovascular   Renal Artery US  Completed Yes      Disposition:    FU with MD/APP/PharmD in 3-4 months    Medication Adjustments/Labs and Tests Ordered: Current medicines are reviewed at length with the patient today.  Concerns regarding medicines are outlined above.  No orders of the defined types were placed in this encounter.  No orders of the defined types were placed in this encounter.    Signed, Reche GORMAN Finder, NP  08/15/2024 11:14 AM    [facility]

## 2024-08-28 ENCOUNTER — Other Ambulatory Visit (HOSPITAL_BASED_OUTPATIENT_CLINIC_OR_DEPARTMENT_OTHER): Payer: Self-pay | Admitting: Cardiovascular Disease

## 2024-08-28 DIAGNOSIS — I1A0 Resistant hypertension: Secondary | ICD-10-CM

## 2024-11-12 ENCOUNTER — Other Ambulatory Visit: Payer: Self-pay | Admitting: Family

## 2024-11-12 DIAGNOSIS — I1A0 Resistant hypertension: Secondary | ICD-10-CM

## 2024-11-18 ENCOUNTER — Encounter (HOSPITAL_BASED_OUTPATIENT_CLINIC_OR_DEPARTMENT_OTHER): Payer: Self-pay | Admitting: Cardiovascular Disease

## 2024-11-18 ENCOUNTER — Ambulatory Visit (INDEPENDENT_AMBULATORY_CARE_PROVIDER_SITE_OTHER): Admitting: Cardiovascular Disease

## 2024-11-18 VITALS — BP 104/62 | HR 59 | Ht 63.5 in | Wt 153.2 lb

## 2024-11-18 DIAGNOSIS — I7100 Dissection of unspecified site of aorta: Secondary | ICD-10-CM | POA: Diagnosis not present

## 2024-11-18 DIAGNOSIS — I1A0 Resistant hypertension: Secondary | ICD-10-CM

## 2024-11-18 NOTE — Progress Notes (Signed)
 [facility]pert[facility] Follow-up:    Date:  11/18/2024   ID:  Stephanie Evans, DOB 1986/07/26, MRN [id number]  PCP:  Benjamine Lauraine DASEN, NP  Cardiologist:  Redell Leiter, MD  Nephrologist:  Referring MD: Benjamine Lauraine DASEN, NP   CC: Hypertension  History of Present Illness:    Stephanie Evans is a 38 y.o. female with a hx of hypertension, Type B aortic dissection s/p TEVAR, and CKD III, here for follow-up. She was initially seen 11/23/2022 in the [facility]. She initially saw Dr. Leiter 07/2022 after an admission for hypertensive urgency. She was not on any medications prior to this event and was started on amlodipine . She first developed hypertension at age 78. She has a history of anaphylaxis with an ACE inhibitor. She was working on lifestyle and had lost 52 lbs, but blood pressure remained in the 200s/130s. Labs were negative for hyperaldosteronism. Metanephrines were normal. Renal artery dopplers were normal 07/2022. She was admitted to the hospital 09/2022 with a type B aortic dissection. She underwent TEVAR 09/20/22. She followed up with Dr. Leiter later that month and her home blood pressures were in the 110s-130s/70s. At the time, she was on carvedilol , clonidine , hydralazine , nifedipine , and spironolactone. She had an Echo 08/2022 that revealed LVEF 65-70% with normal diastolic function.   At her 11/2022 visit, her BP was 172/98 in the setting of forgetting to take her antihypertensives prior to leaving the house. She complained of some lightheadedness and dizziness; she was not quite used to her blood pressures being in the 110s to 120s. She has a family history of very early onset hypertension and I suspected that her etiology is genetic. She was encouraged to gradually increase her exercise. We discussed the importance of limiting all sodium intake with a goal of less than 2300 mg a day. She was struggling with symptoms from clonidine , so it was switched to 0.2 mg twice  daily with a goal of trying to wean it off if possible. Continued carvedilol , nifedipine , and spironolactone. We transitioned hydralazine  to 100 mg twice daily instead of 25 mg every 6 hours.  She was not felt to be a good candidate for renal denervation due to her history of dissection down to the level of the SMA. She saw our pharmacist 12/2022 and her clonidine  was switched from tablets to patches. In 01/2023 she was still seeing some home readings as high as 140-150 systolic, but more frequently in the normal range. At her follow-up 02/2023 she was mostly seeing home readings above normal. She complained of occasional dizzy spells which she thought were related to BP below 120 and being active.  At her appointment 03/2023 she was feeling well and blood pressures were mostly in the 120s to 130s.  She had occasional spikes in her blood pressure.  Blood pressure was 106/66 in the office.  She had a repeat CT scan at [facility] to monitor her aorta repair.  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  She also had postsurgical changes in the thoracic aorta endograft with decreased but persistent flow within the false lumen via retrograde flow.  They recommended continued follow-up with repeat imaging in 1 year.  At her visit 09/2023 she was doing well.  At her visit 04/2024 she reported some elevated blood pressure readings as high as 192/129.  This improved to 134 after taking her medication.  She thinks that may have been due  to missing some medication doses prior.  She was under increased stress after her mother had passed recently.  She followed up with Reche Finder, NP 07/2024 and was exercising regularly.  Blood pressure was well-controlled.  She saw vascular 05/2024 and they plan for 1 year follow-up imaging and clinic follow-up.  Discussed the use of AI scribe software for clinical note transcription with the patient, who gave verbal consent  to proceed.  History of Present Illness [name] notes that at home, her blood pressure readings are typically around 130/70 mmHg or 125 mmHg. However, she occasionally misses a dose, particularly during early morning shifts, which leads to higher readings. She takes her medication as soon as she remembers if she misses a dose and denies any symptoms of hypotension or lightheadedness.  She is currently on a regimen of carvedilol , chlorthalidone , clonidine , hydralazine , nifedipine , and spironolactone, along with baby aspirin . She also uses a patch that she finds helpful during her intense CrossFit sessions, as she feels she cannot perform without it.  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 She participates in CrossFit three times a week. No swelling in her legs or feet.   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 Swallow whole.   carvedilol  (COREG ) 25 MG tablet TAKE 1 TABLET BY MOUTH TWICE A DAY   chlorthalidone  (HYGROTON ) 25 MG tablet Take 1 tablet (25 mg total) by mouth daily.   cloNIDine  (CATAPRES  - DOSED IN MG/24 HR) 0.2 mg/24hr patch Place 1 patch (0.2 mg total) onto the skin once a week.   cloNIDine  (CATAPRES ) 0.2 MG tablet TAKE 1 TABLET BY MOUTH DAILY AS NEEDED FOR BLOOD PRESSURE ABOVE 150   hydrALAZINE  (APRESOLINE ) 100 MG tablet Take 1 tablet (100 mg total) by mouth every 12 (twelve) hours.   MAGNESIUM GLYCINATE PO Take 4 tablets by mouth daily.   NIFEdipine  (PROCARDIA  XL/NIFEDICAL-XL) 90 MG 24 hr tablet TAKE 1 TABLET BY MOUTH EVERY DAY   spironolactone (ALDACTONE) 25 MG tablet Take 1 tablet (25 mg total) by mouth daily.     Allergies:    Lisinopril    Social History   Socioeconomic History   Marital status: Significant Other    Spouse name: Not on file   Number of children: Not on file   Years of education: Not on file   Highest education level: Not on file  Occupational History   Not on file  Tobacco Use   Smoking status: Never   Smokeless tobacco: Never  Vaping Use   Vaping status: Never Used  Substance and Sexual Activity   Alcohol use: Yes    Comment: Rare   Drug use: Yes    Types: Marijuana    Comment: Daily   Sexual activity: Not on file  Other Topics Concern   Not on file  Social History Narrative   Not on file   Social Drivers of Health   Financial Resource Strain: Not on file  Food Insecurity: No Food Insecurity (11/23/2022)   Hunger Vital Sign    Worried About Running Out of Food in the Last Year: Never true    Ran Out of  Food in the Last Year: Never true  Transportation Needs: No Transportation Needs (11/23/2022)   PRAPARE - Administrator, Civil Service (Medical): No    Lack of Transportation (Non-Medical): No  Physical Activity: Inactive (11/23/2022)   Exercise Vital Sign    Days of Exercise per Week: 0 days    Minutes of Exercise per Session: 0 min  Stress: Not on file  Social Connections: Not on file     Family History: The patient's family history includes Breast cancer in her mother; Diabetes in her father and mother; Hypertension in her father, mother, and sister; Sleep apnea in her father.  ROS:   Please see the history of present illness.    (+) Occasional lightheadedness All other systems reviewed and are negative.  EKGs/Labs/Other Studies Reviewed:     EKG Interpretation Date/Time:  Monday November 18 2024 10:20:28 EST Ventricular Rate:  59 PR Interval:  166 QRS Duration:  88 QT Interval:  414 QTC Calculation: 409 R Axis:   -3  Text Interpretation: Sinus bradycardia No significant change since last tracing Confirmed by Raford Riggs ([id number]) on  11/18/2024 10:23:33 AM        CTA Chest/Abdomen/Pelvis  05/29/2023  ([facility]): 1.  Effacement of the sinotubular junction which can be seen in connective tissue disease.  2.  Postsurgical changes of thoracic aortic endograft placement with substantially decreased but persistent flow within the false lumen via retrograde flow.   CTA Chest/Abdomen/Pelvis  10/27/2022  ([facility]): CONCLUSION:  Postprocedural changes of thoracic endograft placement with improved caliber of the true lumen and decreased size but persistent flow within the false lumen of the patient's prior type B aortic aneurysm. Majority of this flow appears retrograde via the distal thoracic/proximal suprarenal abdominal aorta intraluminal defect, however, there is a small persistent intraluminal defect contributing to flow just distal to the left subclavian branch as further described above.   TTE  09/16/2022  ([facility]): SUMMARY  The left ventricular size is normal with normal left ventricular wall  thickness.  Left ventricular systolic function is normal.  LV ejection fraction = 65-70%.  The right ventricle is normal in size and function.  There is no significant valvular stenosis or regurgitation.   Linear echodensity seen in abdominal aorta consistent with dissection  flap.   The IVC is normal in size with an inspiratory collapse of greater then  50%, suggesting normal right atrial pressure.  There is no pericardial effusion.  There is no comparison study available.   Bilateral Renal Artery Dopplers  07/21/2022: Summary:  Largest Aortic Diameter: 2.3 cm    Renal:    Right: Normal size right kidney. Normal right Resisitive Index. No         evidence of right renal artery stenosis.  Left:  Normal size of left kidney. Normal left Resistive Index. No         evidence of left renal artery stenosis.  Mesenteric:  Normal Superior Mesenteric artery and Celiac artery findings.   CTA Chest   09/26/2021: IMPRESSION: No pulmonary embolism. No radiographic evidence of acute cardiopulmonary disease.  EKG:  EKG is personally reviewed. 10/11/2023:  Sinus rhythm. Rate 65 bpm. 04/13/2023:  EKG was not ordered. 11/23/2022: EKG was not ordered.   Recent Labs: No results found for requested labs within last 365 days.   Recent Lipid Panel    Component Value Date/Time   CHOL 202 (H) 10/11/2023 1111   TRIG 97 10/11/2023 1111  HDL 81 10/11/2023 1111   CHOLHDL 2.5 10/11/2023 1111   LDLCALC 104 (H) 10/11/2023 1111    Physical Exam:    VS:  BP 104/62   Pulse (!) 59   Ht 5' 3.5 (1.613 m)   Wt 153 lb 3.2 oz (69.5 kg)   SpO2 99%   BMI 26.71 kg/m  , BMI Body mass index is 26.71 kg/m. GENERAL:  Well appearing HEENT: Pupils equal round and reactive, fundi not visualized, oral mucosa unremarkable NECK:  No jugular venous distention, waveform within normal limits, carotid upstroke brisk and symmetric, no bruits, no thyromegaly LUNGS:  Clear to auscultation bilaterally HEART:  RRR.  PMI not displaced or sustained,S1 and S2 within normal limits, no S3, no S4, no clicks, no rubs, no murmurs ABD:  Flat, positive bowel sounds normal in frequency in pitch, no bruits, no rebound, no guarding, no midline pulsatile mass, no hepatomegaly, no splenomegaly EXT:  2 plus pulses throughout, no edema, no cyanosis no clubbing SKIN:  No rashes no nodules NEURO:  Cranial nerves II through XII grossly intact, motor grossly intact throughout PSYCH:  Cognitively intact, oriented to person place and time   ASSESSMENT/PLAN:    Assessment & Plan # Resistant hypertension Blood pressure generally controlled with current regimen. Occasional elevations due to missed doses. Home readings around 130/70 mmHg, accuracy uncertain.  Not a candidate for RDN given history of aortic dissection. - Continue current antihypertensive regimen: carvedilol , chlorthalidone , clonidine , hydralazine , nifedipine ,  spironolactone. - Bring home blood pressure machine to next appointment for accuracy check.  If her home monitoring runs consistently higher than ours could consider scaling back slightly on her antihypertensive regimen as she has been mostly in the 100 systolic lately in the office. - Consider reducing medication if home readings consistently lower than clinic readings.  # Type B aortic dissection No symptoms or complications. Regular follow-up with vascular specialist scheduled. - Continue follow-up with vascular specialist as scheduled. - Continue baby aspirin  therapy. -Discussed lifting restrictions.  She should not lift anything that makes her have to grunt or Valsalva. - Blood pressure control as above.   Screening for Secondary Hypertension:     11/23/2022    9:26 AM  Causes  Drugs/Herbals Screened     - Comments limits sodium intake,  cooks and eats out, one coffee daily  Renovascular HTN Screened  Sleep Apnea Screened     - Comments snores.  no other symptoms  Coarctation of the Aorta Screened     - Comments BP symmetric    Relevant Labs/Studies:    Latest Ref Rng & Units 10/11/2023   11:11 AM 01/04/2023   10:06 AM 07/20/2022   10:31 AM  Basic Labs  Sodium 134 - 144 mmol/L 138  138  140   Potassium 3.5 - 5.2 mmol/L 3.6  3.5  3.5   Creatinine 0.57 - 1.00 mg/dL 8.43  8.71  8.47           Latest Ref Rng & Units 07/20/2022   10:31 AM  Renin/Aldosterone   Aldosterone 0.0 - 30.0 ng/dL 79.0   Renin 9.832 - 4.619 ng/mL/hr 7.362   Aldos/Renin Ratio 0.0 - 30.0 2.8              07/21/2022    8:31 AM  Renovascular   Renal Artery US  Completed Yes     Disposition:    FU in 1 year  Medication Adjustments/Labs and Tests Ordered: Current medicines are reviewed at length with the patient  today.  Concerns regarding medicines are outlined above.   Orders Placed This Encounter  Procedures   EKG 12-Lead   No orders of the defined types were placed in this  encounter.   Signed, Annabella Scarce, MD  11/18/2024 10:51 AM    [facility]

## 2024-11-18 NOTE — Patient Instructions (Addendum)
 Medication Instructions:  Your physician recommends that you continue on your current medications as directed. Please refer to the Current Medication list given to you today.   Labwork: NONE   Testing/Procedures: NONE  Follow-Up: 1 YEAR WITH CAITLIN W NP OR DR [place]   2 WEEKS NURSE VISIT, BRING YOUR BLOOD PRESSURE MACHINE WITH YOU   If you need a refill on your cardiac medications before your next appointment, please call your pharmacy.
